# Patient Record
Sex: Male | Born: 1970 | Hispanic: Yes | Marital: Married | Smoking: Never smoker
Health system: Southern US, Community
[De-identification: ages and names within clinical notes are randomized; demographics above are authoritative.]

## PROBLEM LIST (undated history)

## (undated) DIAGNOSIS — Z87442 Personal history of urinary calculi: Secondary | ICD-10-CM

## (undated) DIAGNOSIS — N400 Enlarged prostate without lower urinary tract symptoms: Secondary | ICD-10-CM

## (undated) DIAGNOSIS — R519 Headache, unspecified: Secondary | ICD-10-CM

## (undated) DIAGNOSIS — R51 Headache: Secondary | ICD-10-CM

## (undated) DIAGNOSIS — I10 Essential (primary) hypertension: Secondary | ICD-10-CM

## (undated) DIAGNOSIS — F4310 Post-traumatic stress disorder, unspecified: Secondary | ICD-10-CM

## (undated) DIAGNOSIS — F419 Anxiety disorder, unspecified: Secondary | ICD-10-CM

## (undated) DIAGNOSIS — I82621 Acute embolism and thrombosis of deep veins of right upper extremity: Secondary | ICD-10-CM

## (undated) DIAGNOSIS — K219 Gastro-esophageal reflux disease without esophagitis: Secondary | ICD-10-CM

## (undated) DIAGNOSIS — F32A Depression, unspecified: Secondary | ICD-10-CM

## (undated) DIAGNOSIS — J302 Other seasonal allergic rhinitis: Secondary | ICD-10-CM

## (undated) DIAGNOSIS — I8290 Acute embolism and thrombosis of unspecified vein: Secondary | ICD-10-CM

## (undated) DIAGNOSIS — G8929 Other chronic pain: Secondary | ICD-10-CM

## (undated) DIAGNOSIS — M542 Cervicalgia: Secondary | ICD-10-CM

## (undated) DIAGNOSIS — F329 Major depressive disorder, single episode, unspecified: Secondary | ICD-10-CM

## (undated) NOTE — *Deleted (*Deleted)
   08/24/2020  CC: No chief complaint on file.   Cristian Pena is a 37 y.o. male who returns for a cystoscopy.   He initially presented to his primary earlier this month with several episodes of gross hematuria without clots.  He was also complaining of bilateral testicular pain feeling like his testicles were swollen from time to time.  He experienced intercourse pain with intercourse and eventually developed erectile dysfunction and inability to maintain achieve erections.  He also had some associated urinary urgency frequency.  He was treated with antibiotics for presumed prostatitis (month-long course of Levaquin which he is currently taking).  Flomax and finasteride were also initiated.  Unfortunately, no urinalysis or urine culture was obtained.  He returned to his primary care in follow-up.  On scrotal exam he was noted to have bilateral varicoceles and sent here for further evaluation of this.  At the time, these were felt to be tender.    His urinary symptoms and pain were improving.  He continued to complain of erectile dysfunction, recent since the onset of his symptoms.  He does have a personal history of urinary complaints.  His last seen by Dr. Delton See in 2009 with dysuria and bilateral leg pain as well as burning of the head of his penis and testicular pain during urination.  Most recent PSA 1.2 10/2018  CT hematuria work up on 07/28/2020 noted two tiny nonobstructing left renal stones. No other urinary stone disease evident. No suspicious renal or urinary tract mass lesion. Patchy airspace disease in the central right middle lobe, likely infectious/inflammatory. Small right groin hernia contains only fat.  He does have a family history of prostate cancer in his father.  He is concerned about malignancy.  He also has a personal history of kidney stones, left stone on CT in 2019.  No recent flank pain.   There were no vitals taken for this visit. NED. A&Ox3.   No  respiratory distress   Abd soft, NT, ND Normal phallus with bilateral descended testicles  Cystoscopy Procedure Note  Patient identification was confirmed, informed consent was obtained, and patient was prepped using Betadine solution.  Lidocaine jelly was administered per urethral meatus.     Pre-Procedure: - Inspection reveals a normal caliber ureteral meatus.  Procedure: The flexible cystoscope was introduced without difficulty - No urethral strictures/lesions are present. - {Blank multiple:19197::"Enlarged","Surgically absent","Normal"} prostate *** - {Blank multiple:19197::"Normal","Elevated","Tight"} bladder neck - Bilateral ureteral orifices identified - Bladder mucosa  reveals no ulcers, tumors, or lesions - No bladder stones - No trabeculation  Retroflexion shows ***   Post-Procedure: - Patient tolerated the procedure well  Pertinent image:  I have personally reviewed the images and agree with radiologist interpretation.    Assessment/ Plan:  1. Chronic prostatitis  2. Gross hematuria  3. Bilateral varicoceles  4. Erectile disorder, acquired, situational, severe  5. Family history of prostate cancer    No follow-ups on file.  Cristian Pena, am acting as a scribe for Dr. Vanna Scotland.  {Add Production assistant, radio Statement}

---

## 2004-06-27 ENCOUNTER — Other Ambulatory Visit: Payer: Self-pay

## 2004-07-29 ENCOUNTER — Emergency Department: Payer: Self-pay | Admitting: Unknown Physician Specialty

## 2011-02-13 ENCOUNTER — Ambulatory Visit: Payer: Self-pay | Admitting: Gastroenterology

## 2011-02-15 LAB — PATHOLOGY REPORT

## 2012-12-31 ENCOUNTER — Emergency Department: Payer: Self-pay | Admitting: Emergency Medicine

## 2012-12-31 LAB — DRUG SCREEN, URINE
Amphetamines, Ur Screen: NEGATIVE (ref ?–1000)
Barbiturates, Ur Screen: NEGATIVE (ref ?–200)
Benzodiazepine, Ur Scrn: NEGATIVE (ref ?–200)
Cannabinoid 50 Ng, Ur ~~LOC~~: NEGATIVE (ref ?–50)
Opiate, Ur Screen: NEGATIVE (ref ?–300)
Phencyclidine (PCP) Ur S: NEGATIVE (ref ?–25)
Tricyclic, Ur Screen: NEGATIVE (ref ?–1000)

## 2012-12-31 LAB — URINALYSIS, COMPLETE
Bacteria: NONE SEEN
Bilirubin,UR: NEGATIVE
Glucose,UR: NEGATIVE mg/dL (ref 0–75)
Leukocyte Esterase: NEGATIVE
Nitrite: NEGATIVE
Ph: 5 (ref 4.5–8.0)
Protein: NEGATIVE
WBC UR: <1 /HPF (ref 0–5)

## 2012-12-31 LAB — CBC
HGB: 15.1 g/dL (ref 13.0–18.0)
MCV: 86 fL (ref 80–100)
RBC: 5.29 10*6/uL (ref 4.40–5.90)
WBC: 13.6 10*3/uL — ABNORMAL HIGH (ref 3.8–10.6)

## 2012-12-31 LAB — TSH: Thyroid Stimulating Horm: 0.68 u[IU]/mL

## 2012-12-31 LAB — COMPREHENSIVE METABOLIC PANEL
Albumin: 4.1 g/dL (ref 3.4–5.0)
Calcium, Total: 8.6 mg/dL (ref 8.5–10.1)
Chloride: 108 mmol/L — ABNORMAL HIGH (ref 98–107)
Co2: 26 mmol/L (ref 21–32)
EGFR (African American): >60
Glucose: 138 mg/dL — ABNORMAL HIGH (ref 65–99)
Osmolality: 285 (ref 275–301)
SGOT(AST): 33 U/L (ref 15–37)
SGPT (ALT): 36 U/L (ref 12–78)
Sodium: 140 mmol/L (ref 136–145)
Total Protein: 7.9 g/dL (ref 6.4–8.2)

## 2012-12-31 LAB — ETHANOL
Ethanol %: <0.003 % (ref 0.000–0.080)
Ethanol: <3 mg/dL

## 2013-06-12 ENCOUNTER — Emergency Department: Payer: Self-pay | Admitting: Emergency Medicine

## 2013-06-12 LAB — COMPREHENSIVE METABOLIC PANEL
Alkaline Phosphatase: 91 U/L (ref 50–136)
Anion Gap: 5 — ABNORMAL LOW (ref 7–16)
BUN: 17 mg/dL (ref 7–18)
Bilirubin,Total: 0.4 mg/dL (ref 0.2–1.0)
Chloride: 106 mmol/L (ref 98–107)
EGFR (African American): >60
EGFR (Non-African Amer.): >60
Glucose: 133 mg/dL — ABNORMAL HIGH (ref 65–99)
Osmolality: 277 (ref 275–301)
SGPT (ALT): 33 U/L (ref 12–78)
Sodium: 137 mmol/L (ref 136–145)
Total Protein: 7.3 g/dL (ref 6.4–8.2)

## 2013-06-12 LAB — URINALYSIS, COMPLETE
Bilirubin,UR: NEGATIVE
Glucose,UR: NEGATIVE mg/dL (ref 0–75)
Ketone: NEGATIVE
Leukocyte Esterase: NEGATIVE
Nitrite: NEGATIVE
Ph: 5 (ref 4.5–8.0)
Protein: NEGATIVE
Squamous Epithelial: NONE SEEN
WBC UR: 2 /HPF (ref 0–5)

## 2013-06-12 LAB — DRUG SCREEN, URINE
Barbiturates, Ur Screen: NEGATIVE (ref ?–200)
Benzodiazepine, Ur Scrn: NEGATIVE (ref ?–200)
Cocaine Metabolite,Ur ~~LOC~~: POSITIVE (ref ?–300)
Phencyclidine (PCP) Ur S: NEGATIVE (ref ?–25)

## 2013-06-12 LAB — CBC
MCH: 29.5 pg (ref 26.0–34.0)
MCV: 83 fL (ref 80–100)
Platelet: 201 10*3/uL (ref 150–440)
RDW: 13.4 % (ref 11.5–14.5)
WBC: 7.3 10*3/uL (ref 3.8–10.6)

## 2013-06-12 LAB — LIPASE, BLOOD: Lipase: 129 U/L (ref 73–393)

## 2013-11-18 DIAGNOSIS — F1410 Cocaine abuse, uncomplicated: Secondary | ICD-10-CM | POA: Insufficient documentation

## 2014-02-11 DIAGNOSIS — K645 Perianal venous thrombosis: Secondary | ICD-10-CM | POA: Insufficient documentation

## 2014-06-28 ENCOUNTER — Emergency Department: Payer: Self-pay | Admitting: Emergency Medicine

## 2014-06-28 LAB — COMPREHENSIVE METABOLIC PANEL
ALK PHOS: 107 U/L
Albumin: 3.7 g/dL (ref 3.4–5.0)
Anion Gap: 8 (ref 7–16)
BILIRUBIN TOTAL: 0.5 mg/dL (ref 0.2–1.0)
BUN: 18 mg/dL (ref 7–18)
CALCIUM: 8.4 mg/dL — AB (ref 8.5–10.1)
CHLORIDE: 103 mmol/L (ref 98–107)
CO2: 28 mmol/L (ref 21–32)
CREATININE: 1.07 mg/dL (ref 0.60–1.30)
GLUCOSE: 114 mg/dL — AB (ref 65–99)
Osmolality: 280 (ref 275–301)
POTASSIUM: 3.9 mmol/L (ref 3.5–5.1)
SGOT(AST): 28 U/L (ref 15–37)
SGPT (ALT): 37 U/L
Sodium: 139 mmol/L (ref 136–145)
Total Protein: 7.5 g/dL (ref 6.4–8.2)

## 2014-06-28 LAB — CBC WITH DIFFERENTIAL/PLATELET
BASOS PCT: 0.3 %
Basophil #: 0 10*3/uL (ref 0.0–0.1)
EOS PCT: 0.6 %
Eosinophil #: 0.1 10*3/uL (ref 0.0–0.7)
HCT: 54.2 % — ABNORMAL HIGH (ref 40.0–52.0)
HGB: 18.4 g/dL — AB (ref 13.0–18.0)
LYMPHS PCT: 6.9 %
Lymphocyte #: 0.9 10*3/uL — ABNORMAL LOW (ref 1.0–3.6)
MCH: 29 pg (ref 26.0–34.0)
MCHC: 33.9 g/dL (ref 32.0–36.0)
MCV: 86 fL (ref 80–100)
MONO ABS: 0.5 x10 3/mm (ref 0.2–1.0)
Monocyte %: 4 %
Neutrophil #: 11.6 10*3/uL — ABNORMAL HIGH (ref 1.4–6.5)
Neutrophil %: 88.2 %
Platelet: 223 10*3/uL (ref 150–440)
RBC: 6.34 10*6/uL — ABNORMAL HIGH (ref 4.40–5.90)
RDW: 13.2 % (ref 11.5–14.5)
WBC: 13.2 10*3/uL — AB (ref 3.8–10.6)

## 2014-06-28 LAB — LIPASE, BLOOD: Lipase: 132 U/L (ref 73–393)

## 2014-06-29 LAB — URINALYSIS, COMPLETE
BILIRUBIN, UR: NEGATIVE
BLOOD: NEGATIVE
Bacteria: NONE SEEN
Glucose,UR: NEGATIVE mg/dL (ref 0–75)
Ketone: NEGATIVE
Leukocyte Esterase: NEGATIVE
Nitrite: NEGATIVE
Ph: 6 (ref 4.5–8.0)
Protein: NEGATIVE
Specific Gravity: >1.06 (ref 1.003–1.030)
Squamous Epithelial: NONE SEEN

## 2014-12-24 DIAGNOSIS — R079 Chest pain, unspecified: Secondary | ICD-10-CM | POA: Insufficient documentation

## 2014-12-24 DIAGNOSIS — E782 Mixed hyperlipidemia: Secondary | ICD-10-CM | POA: Insufficient documentation

## 2014-12-24 DIAGNOSIS — R0681 Apnea, not elsewhere classified: Secondary | ICD-10-CM | POA: Insufficient documentation

## 2015-02-17 DIAGNOSIS — G5680 Other specified mononeuropathies of unspecified upper limb: Secondary | ICD-10-CM | POA: Insufficient documentation

## 2015-02-17 DIAGNOSIS — M7581 Other shoulder lesions, right shoulder: Secondary | ICD-10-CM

## 2015-02-17 DIAGNOSIS — M47812 Spondylosis without myelopathy or radiculopathy, cervical region: Secondary | ICD-10-CM | POA: Insufficient documentation

## 2015-02-17 DIAGNOSIS — M778 Other enthesopathies, not elsewhere classified: Secondary | ICD-10-CM | POA: Insufficient documentation

## 2015-02-17 DIAGNOSIS — S43439A Superior glenoid labrum lesion of unspecified shoulder, initial encounter: Secondary | ICD-10-CM | POA: Insufficient documentation

## 2015-04-09 DIAGNOSIS — M778 Other enthesopathies, not elsewhere classified: Secondary | ICD-10-CM | POA: Insufficient documentation

## 2015-04-09 DIAGNOSIS — M7989 Other specified soft tissue disorders: Secondary | ICD-10-CM | POA: Insufficient documentation

## 2015-04-09 DIAGNOSIS — M7582 Other shoulder lesions, left shoulder: Secondary | ICD-10-CM

## 2015-04-20 ENCOUNTER — Emergency Department: Payer: BLUE CROSS/BLUE SHIELD

## 2015-04-20 ENCOUNTER — Emergency Department
Admission: EM | Admit: 2015-04-20 | Discharge: 2015-04-20 | Disposition: A | Discharge status: Home / Self Care | Payer: BLUE CROSS/BLUE SHIELD | Attending: Student | Admitting: Student

## 2015-04-20 ENCOUNTER — Other Ambulatory Visit: Payer: Self-pay

## 2015-04-20 ENCOUNTER — Encounter: Payer: Self-pay | Admitting: General Practice

## 2015-04-20 DIAGNOSIS — R0602 Shortness of breath: Secondary | ICD-10-CM | POA: Insufficient documentation

## 2015-04-20 DIAGNOSIS — I10 Essential (primary) hypertension: Secondary | ICD-10-CM | POA: Insufficient documentation

## 2015-04-20 DIAGNOSIS — R079 Chest pain, unspecified: Secondary | ICD-10-CM | POA: Insufficient documentation

## 2015-04-20 HISTORY — DX: Anxiety disorder, unspecified: F41.9

## 2015-04-20 HISTORY — DX: Essential (primary) hypertension: I10

## 2015-04-20 LAB — TROPONIN I
Troponin I: <0.03 ng/mL (ref <0.031)
Troponin I: <0.03 ng/mL (ref <0.031)

## 2015-04-20 LAB — CBC
HCT: 48.2 % (ref 40.0–52.0)
Hemoglobin: 16.3 g/dL (ref 13.0–18.0)
MCH: 29.5 pg (ref 26.0–34.0)
MCHC: 33.8 g/dL (ref 32.0–36.0)
MCV: 87.2 fL (ref 80.0–100.0)
PLATELETS: 214 10*3/uL (ref 150–440)
RBC: 5.53 MIL/uL (ref 4.40–5.90)
RDW: 13.9 % (ref 11.5–14.5)
WBC: 11.2 10*3/uL — ABNORMAL HIGH (ref 3.8–10.6)

## 2015-04-20 LAB — BASIC METABOLIC PANEL
Anion gap: 8 (ref 5–15)
BUN: 18 mg/dL (ref 6–20)
CALCIUM: 9.2 mg/dL (ref 8.9–10.3)
CO2: 25 mmol/L (ref 22–32)
Chloride: 107 mmol/L (ref 101–111)
Creatinine, Ser: 0.99 mg/dL (ref 0.61–1.24)
GLUCOSE: 110 mg/dL — AB (ref 65–99)
POTASSIUM: 4.3 mmol/L (ref 3.5–5.1)
Sodium: 140 mmol/L (ref 135–145)

## 2015-04-20 MED ORDER — ACETAMINOPHEN 500 MG PO TABS
1000.0000 mg | ORAL_TABLET | Freq: Once | ORAL | Status: AC
  Administered 2015-04-20: 1000 mg via ORAL

## 2015-04-20 MED ORDER — ACETAMINOPHEN 500 MG PO TABS
ORAL_TABLET | ORAL | Status: AC
  Administered 2015-04-20: 1000 mg via ORAL
  Filled 2015-04-20: qty 2

## 2015-04-20 MED ORDER — IBUPROFEN 600 MG PO TABS
ORAL_TABLET | ORAL | Status: AC
  Administered 2015-04-20: 600 mg via ORAL
  Filled 2015-04-20: qty 1

## 2015-04-20 MED ORDER — ASPIRIN 81 MG PO CHEW
324.0000 mg | CHEWABLE_TABLET | Freq: Once | ORAL | Status: AC
  Administered 2015-04-20: 324 mg via ORAL

## 2015-04-20 MED ORDER — ASPIRIN 81 MG PO CHEW
CHEWABLE_TABLET | ORAL | Status: AC
  Administered 2015-04-20: 324 mg via ORAL
  Filled 2015-04-20: qty 4

## 2015-04-20 MED ORDER — IBUPROFEN 600 MG PO TABS
600.0000 mg | ORAL_TABLET | Freq: Once | ORAL | Status: AC
  Administered 2015-04-20: 600 mg via ORAL

## 2015-04-20 NOTE — ED Provider Notes (Signed)
[facility] Emergency Department Provider Note  ____________________________________________  Time seen: Approximately 6:04 PM  I have reviewed the triage vital signs and the nursing notes.   HISTORY  Chief Complaint Chest Pain  Spanish language barrier overcome with use of interpreter.  HPI Cristian Pena is a 44 y.o. male with history of hypertension and anxiety presents for evaluation sudden onset left stabbing chest pain today. Patient reports that he had an argument with his wife earlier today. He walked outside to get some air and at 4:30 PM began having stabbing severe left-sided chest pain associated with lightheadedness and shortness of breath. The pain does not radiate into jaw, back, legs. Currently he has mild residual chest pain. He reports that he has had similar chest pain in the past although it has been less severe. He had a negative stress test 3 or 4 months ago at [place] [facility]. No modifying factors. Prior to today he had been in his usual state of health, no cough, sneezing, runny nose, vomiting, diarrhea, fevers or chills.   Past Medical History  Diagnosis Date  . Hypertension   . Anxiety     There are no active problems to display for this patient.   History reviewed. No pertinent past surgical history.  No current outpatient prescriptions on file.  Allergies Review of patient's allergies indicates no known allergies.  No family history on file.  Social History History  Substance Use Topics  . Smoking status: Never Smoker   . Smokeless tobacco: Not on file  . Alcohol Use: Yes    Review of Systems Constitutional: No fever/chills Eyes: No visual changes. ENT: No sore throat. Cardiovascular: +chest pain. Respiratory: +shortness of breath. Gastrointestinal: No abdominal pain.  No nausea, no vomiting.  No diarrhea.  No constipation. Genitourinary: Negative for dysuria. Musculoskeletal: Negative for back pain. Skin: Negative  for rash. Neurological: Negative for headaches, focal weakness or numbness.  10-point ROS otherwise negative.  ____________________________________________   PHYSICAL EXAM:  Filed Vitals:   04/20/15 1748 04/20/15 1808  BP:  130/96  Pulse:  94  Resp:  24  Height: 5\' 6"  (1.676 m)   Weight: 180 lb (81.647 kg)   SpO2:  97%    VITAL SIGNS: ED Triage Vitals  Enc Vitals Group     BP --      Pulse --      Resp --      Temp --      Temp src --      SpO2 --      Weight 04/20/15 1748 180 lb (81.647 kg)     Height 04/20/15 1748 5\' 6"  (1.676 m)     Head Cir --      Peak Flow --      Pain Score 04/20/15 1749 6     Pain Loc --      Pain Edu? --      Excl. in [place]? --     Constitutional: Alert and oriented. Well appearing and in no acute distress. Becomes tearful when recounting the argument with his wife.  Eyes: Conjunctivae are normal. PERRL. EOMI. Head: Atraumatic. Nose: No congestion/rhinnorhea. Mouth/Throat: Mucous membranes are moist.  Oropharynx non-erythematous. Neck: No stridor.  Cardiovascular: Normal rate, regular rhythm. Grossly normal heart sounds.  Good peripheral circulation. Respiratory: Normal respiratory effort.  No retractions. Lungs CTAB. Gastrointestinal: Soft and nontender. No distention. No abdominal bruits. No CVA tenderness. Genitourinary: deferred Musculoskeletal: No lower extremity tenderness nor edema.  No joint effusions.  Neurologic:  Normal speech and language. No gross focal neurologic deficits are appreciated. Speech is normal. No gait instability. Skin:  Skin is warm, dry and intact. No rash noted. Psychiatric: Mood and affect are normal. Speech and behavior are normal.  ____________________________________________   LABS (all labs ordered are listed, but only abnormal results are displayed)  Labs Reviewed  BASIC METABOLIC PANEL - Abnormal; Notable for the following:    Glucose, Bld 110 (*)    All other components within normal limits  CBC  - Abnormal; Notable for the following:    WBC 11.2 (*)    All other components within normal limits  TROPONIN I   ____________________________________________  EKG  ED ECG REPORT I, Joanne Gavel, the attending physician, personally viewed and interpreted this ECG.   Date: 04/20/2015  EKG Time: 17:55  Rate: 97  Rhythm: normal EKG, normal sinus rhythm  Axis: normal  Intervals:none  ST&T Change: No acute ST segment elevation  ____________________________________________  RADIOLOGY FINDINGS: The cardiomediastinal contours are normal. The lungs are clear. Pulmonary vasculature is normal. No consolidation, pleural effusion, or pneumothorax. No acute osseous abnormalities are seen.  IMPRESSION: No acute pulmonary process.  ____________________________________________   PROCEDURES  Procedure(s) performed: None  Critical Care performed: No  ____________________________________________   INITIAL IMPRESSION / ASSESSMENT AND PLAN / ED COURSE  Pertinent labs & imaging results that were available during my care of the patient were reviewed by me and considered in my medical decision making (see chart for details).  Cristian Pena is a 44 y.o. male with history of hypertension and anxiety presents for evaluation sudden onset left stabbing chest pain today. On exam, he is generally well-appearing and in no acute distress but does become tearful when he talks about the argument he had with his wife today. Vital signs stable, he is afebrile. EKG is reassuring. First troponin negative. I discussed the case with Dr. Ubaldo Glassing of cardiology who has confirmed the patient did have a normal stress test in March 2016. The patient has a heart score of 2, no family history of early coronary artery disease, no personal history of coronary artery disease. He denies risk factors for PE and is PERC negative. Suspect anxiety related chest pain. Not consistent with acute aortic dissection. First troponin is  negative but will obtain a troponin at least 3 hours from onset of symptoms. Aspirin given.   ----------------------------------------- 9:04 PM on 04/20/2015 -----------------------------------------  Troponin negative 2 (with at least one troponin obtained approximately 4 hours after chest pain onset). Pain significantly improved. I discussed lab results today, likely diagnosis of anxiety related chest pain with the assistance of the interpreter. All questions answered. Patient will follow up with his primary care doctor and is comfortable with the discharge plan. ____________________________________________   FINAL CLINICAL IMPRESSION(S) / ED DIAGNOSES  Final diagnoses:  Chest pain, unspecified chest pain type      Joanne Gavel, MD 04/20/15 2105

## 2015-04-20 NOTE — ED Notes (Signed)
Pt. Arrived to ed from home with reports of experiencing left side chest pain. PT states "i got worse today". Pt verbalized experiencing similar pain to left chest before but states "this is bad". Pt alert and oriented. Reports SOB with pain.

## 2015-04-20 NOTE — ED Notes (Signed)
AAOx3.  Skin warm and dry.  No SOB/ DOE.  NAD.  D/C home.  Ambulates with easy and steady gait.  

## 2015-04-20 NOTE — ED Notes (Signed)
Patient reports arguments at home with spouse for the past three days.  States it has been very stressful for him.

## 2015-05-07 DIAGNOSIS — I10 Essential (primary) hypertension: Secondary | ICD-10-CM | POA: Insufficient documentation

## 2015-05-11 ENCOUNTER — Encounter: Payer: Self-pay | Admitting: Emergency Medicine

## 2015-05-11 ENCOUNTER — Inpatient Hospital Stay
Admission: EM | Admit: 2015-05-11 | Discharge: 2015-05-12 | DRG: 558 | Discharge status: Against Medical Advice | Payer: BLUE CROSS/BLUE SHIELD | Attending: Internal Medicine | Admitting: Internal Medicine

## 2015-05-11 DIAGNOSIS — I10 Essential (primary) hypertension: Secondary | ICD-10-CM | POA: Diagnosis present

## 2015-05-11 DIAGNOSIS — Z833 Family history of diabetes mellitus: Secondary | ICD-10-CM | POA: Diagnosis not present

## 2015-05-11 DIAGNOSIS — M6282 Rhabdomyolysis: Secondary | ICD-10-CM | POA: Diagnosis not present

## 2015-05-11 DIAGNOSIS — Z8249 Family history of ischemic heart disease and other diseases of the circulatory system: Secondary | ICD-10-CM

## 2015-05-11 DIAGNOSIS — F419 Anxiety disorder, unspecified: Secondary | ICD-10-CM | POA: Diagnosis present

## 2015-05-11 DIAGNOSIS — G4700 Insomnia, unspecified: Secondary | ICD-10-CM | POA: Diagnosis present

## 2015-05-11 DIAGNOSIS — Z823 Family history of stroke: Secondary | ICD-10-CM

## 2015-05-11 HISTORY — DX: Benign prostatic hyperplasia without lower urinary tract symptoms: N40.0

## 2015-05-11 HISTORY — DX: Post-traumatic stress disorder, unspecified: F43.10

## 2015-05-11 LAB — CBC WITH DIFFERENTIAL/PLATELET
BASOS ABS: 0 10*3/uL (ref 0–0.1)
Basophils Relative: 1 %
EOS ABS: 0.1 10*3/uL (ref 0–0.7)
Eosinophils Relative: 1 %
HEMATOCRIT: 42.1 % (ref 40.0–52.0)
HEMOGLOBIN: 14.2 g/dL (ref 13.0–18.0)
LYMPHS PCT: 29 %
Lymphs Abs: 2.6 10*3/uL (ref 1.0–3.6)
MCH: 29.6 pg (ref 26.0–34.0)
MCHC: 33.8 g/dL (ref 32.0–36.0)
MCV: 87.7 fL (ref 80.0–100.0)
Monocytes Absolute: 0.9 10*3/uL (ref 0.2–1.0)
Monocytes Relative: 10 %
NEUTROS PCT: 59 %
Neutro Abs: 5.5 10*3/uL (ref 1.4–6.5)
PLATELETS: 188 10*3/uL (ref 150–440)
RBC: 4.81 MIL/uL (ref 4.40–5.90)
RDW: 13.5 % (ref 11.5–14.5)
WBC: 9.2 10*3/uL (ref 3.8–10.6)

## 2015-05-11 LAB — BASIC METABOLIC PANEL
Anion gap: 7 (ref 5–15)
BUN: 21 mg/dL — AB (ref 6–20)
CALCIUM: 8.2 mg/dL — AB (ref 8.9–10.3)
CHLORIDE: 107 mmol/L (ref 101–111)
CO2: 24 mmol/L (ref 22–32)
CREATININE: 0.92 mg/dL (ref 0.61–1.24)
GFR calc Af Amer: >60 mL/min (ref >60)
GFR calc non Af Amer: >60 mL/min (ref >60)
Glucose, Bld: 105 mg/dL — ABNORMAL HIGH (ref 65–99)
Potassium: 3.7 mmol/L (ref 3.5–5.1)
Sodium: 138 mmol/L (ref 135–145)

## 2015-05-11 LAB — CK: Total CK: 12088 U/L — ABNORMAL HIGH (ref 49–397)

## 2015-05-11 MED ORDER — TAMSULOSIN HCL 0.4 MG PO CAPS: 0.4000 mg | ORAL_CAPSULE | Freq: Every day | ORAL | Status: DC

## 2015-05-11 MED ORDER — MORPHINE SULFATE 2 MG/ML IJ SOLN: 2.0000 mg | INTRAMUSCULAR | Status: DC | PRN

## 2015-05-11 MED ORDER — OXYCODONE HCL 5 MG PO TABS
5.0000 mg | ORAL_TABLET | ORAL | Status: DC | PRN
  Administered 2015-05-11: 5 mg via ORAL
  Filled 2015-05-11: qty 1

## 2015-05-11 MED ORDER — SODIUM CHLORIDE 0.9 % IV BOLUS (SEPSIS)
1000.0000 mL | Freq: Once | INTRAVENOUS | Status: AC
  Administered 2015-05-11: 1000 mL via INTRAVENOUS

## 2015-05-11 MED ORDER — ACETAMINOPHEN 325 MG PO TABS: 650.0000 mg | ORAL_TABLET | Freq: Four times a day (QID) | ORAL | Status: DC | PRN

## 2015-05-11 MED ORDER — BUPROPION HCL ER (XL) 150 MG PO TB24: 150.0000 mg | ORAL_TABLET | Freq: Every day | ORAL | Status: DC

## 2015-05-11 MED ORDER — HEPARIN SODIUM (PORCINE) 5000 UNIT/ML IJ SOLN
5000.0000 [IU] | Freq: Three times a day (TID) | INTRAMUSCULAR | Status: DC
  Administered 2015-05-11: 5000 [IU] via SUBCUTANEOUS
  Filled 2015-05-11 (×2): qty 1

## 2015-05-11 MED ORDER — MIRTAZAPINE 30 MG PO TABS
45.0000 mg | ORAL_TABLET | Freq: Every day | ORAL | Status: DC
  Administered 2015-05-11: 45 mg via ORAL
  Filled 2015-05-11: qty 1

## 2015-05-11 MED ORDER — ACETAMINOPHEN 650 MG RE SUPP: 650.0000 mg | Freq: Four times a day (QID) | RECTAL | Status: DC | PRN

## 2015-05-11 MED ORDER — SODIUM CHLORIDE 0.9 % IV SOLN
INTRAVENOUS | Status: DC
  Administered 2015-05-11: via INTRAVENOUS

## 2015-05-11 MED ORDER — LISINOPRIL 20 MG PO TABS: 20.0000 mg | ORAL_TABLET | Freq: Every day | ORAL | Status: DC

## 2015-05-11 NOTE — ED Notes (Signed)
MD Scheavitz at bedside at this time

## 2015-05-11 NOTE — ED Notes (Signed)
Labs sent, no new orders at this time.

## 2015-05-11 NOTE — ED Notes (Signed)
MD Hower at bedside. 

## 2015-05-11 NOTE — ED Notes (Signed)
MD Schaevitz at bedside at this time.

## 2015-05-11 NOTE — ED Provider Notes (Signed)
[facility] Emergency Department Provider Note  ____________________________________________  Time seen: Approximately 9 PM  I have reviewed the triage vital signs and the nursing notes.   HISTORY  Chief Complaint Abnormal Lab; Leg Pain; and Anxiety    HPI Cristian Pena is a 44 y.o. male with a history of hypertension and anxiety who presents with lower extremity muscle cramps. He was seen earlier today at the [facility] clinic outpatient and a creatinine kinase was drawn which was over 5000. He says that Sunday night he was pacing in his room overnight for 15 minutes at a time twice an hour all night. He says that this was the last time he did this and did not repeat it last night. He said that he was doing his because of his anxiety. He says that he feels anxious about that things are happening to people in the world, such as "being carried away in a tornado."Denies any suicidal or homicidal ideation. Denies any cocaine use. Does not take a statin. Does take several vitamin supplements. Denies any darkening of his urine.   Past Medical History  Diagnosis Date  . Hypertension   . Anxiety   . BPH (benign prostatic hyperplasia)   . PTSD (post-traumatic stress disorder)     There are no active problems to display for this patient.   History reviewed. No pertinent past surgical history.  No current outpatient prescriptions on file.  Allergies Review of patient's allergies indicates no known allergies.  No family history on file.  Social History History  Substance Use Topics  . Smoking status: Never Smoker   . Smokeless tobacco: Not on file  . Alcohol Use: No    Review of Systems Constitutional: No fever/chills Eyes: No visual changes. ENT: No sore throat. Cardiovascular: Denies chest pain. Respiratory: Denies shortness of breath. Gastrointestinal: No abdominal pain.  No nausea, no vomiting.  No diarrhea.  No constipation. Genitourinary: Negative  for dysuria. Musculoskeletal: Negative for back pain. Skin: Negative for rash. Neurological: Negative for headaches, focal weakness or numbness.  10-point ROS otherwise negative.  ____________________________________________   PHYSICAL EXAM:  VITAL SIGNS: ED Triage Vitals  Enc Vitals Group     BP 05/11/15 1744 118/84 mmHg     Pulse Rate 05/11/15 1744 111     Resp 05/11/15 1744 16     Temp 05/11/15 1744 98.5 F (36.9 C)     Temp Source 05/11/15 1744 Oral     SpO2 05/11/15 1744 97 %     Weight 05/11/15 1742 185 lb (83.915 kg)     Height 05/11/15 1742 5\' 6"  (1.676 m)     Head Cir --      Peak Flow --      Pain Score 05/11/15 1742 6     Pain Loc --      Pain Edu? --      Excl. in [place]? --     Constitutional: Alert and oriented. Well appearing and in no acute distress. Eyes: Conjunctivae are normal. PERRL. EOMI. Head: Atraumatic. Nose: No congestion/rhinnorhea. Mouth/Throat: Mucous membranes are moist.  Oropharynx non-erythematous. Neck: No stridor.   Cardiovascular: Normal rate, regular rhythm. Grossly normal heart sounds.  Good peripheral circulation. Respiratory: Normal respiratory effort.  No retractions. Lungs CTAB. Gastrointestinal: Soft and nontender. No distention. No abdominal bruits. No CVA tenderness. Musculoskeletal: No lower extremity tenderness nor edema.  No joint effusions. Compartments are soft throughout. Able to range lower extremities bilaterally without any distress or pain or restricted range  of motion. Neurologic:  Normal speech and language. No gross focal neurologic deficits are appreciated. No gait instability. Skin:  Skin is warm, dry and intact. No rash noted. Psychiatric: Mood and affect are normal. Speech and behavior are normal.  ____________________________________________   LABS (all labs ordered are listed, but only abnormal results are displayed)  Labs Reviewed  CBC WITH DIFFERENTIAL/PLATELET  URINALYSIS COMPLETEWITH MICROSCOPIC ([place])  CK  BASIC METABOLIC PANEL   ____________________________________________  EKG   ____________________________________________  RADIOLOGY   ____________________________________________   PROCEDURES    ____________________________________________   INITIAL IMPRESSION / ASSESSMENT AND PLAN / ED COURSE  Pertinent labs & imaging results that were available during my care of the patient were reviewed by me and considered in my medical decision making (see chart for details).  ----------------------------------------- 9:43 PM on 05/11/2015 -----------------------------------------  Patient on several supplements including "super module supplements"  and shot b ginseng 400.  He denies taking any excessive amounts of these medications. The shot b as well as super monitor supplement both contain vitamin B as well as ginseng. No obvious ingredient to cause rhabdomyolysis. Signed out to Dr. Lavetta Nielsen for admission. Given work note for job. ____________________________________________   FINAL CLINICAL IMPRESSION(S) / ED DIAGNOSES  Acute rhabdomyolysis. Initial visit.    Orbie Pyo, MD 05/11/15 [phone]

## 2015-05-11 NOTE — ED Notes (Signed)
Pt here from [facility] Ed with c/o leg pain, reports he's been walking a lot for the past 36 hrs. [place] reports CK above 5,000. Pt reports he walks a lot when he becomes very anxious.

## 2015-05-11 NOTE — H&P (Signed)
[name] [place] NAME: Cristian Pena    MR#:  [id number]  DATE OF BIRTH:  07/04/1971   DATE OF ADMISSION:  05/11/2015  PRIMARY CARE PHYSICIAN: Langley Gauss  REQUESTING/REFERRING PHYSICIAN: Schaevitz  CHIEF COMPLAINT:   Chief Complaint  Patient presents with  . Abnormal Lab  . Leg Pain  . Anxiety    HISTORY OF PRESENT ILLNESS:  Cristian Pena  is a 44 y.o. male with a known history of essential hypertension, anxiety disorder not otherwise specified presenting with leg pain. He describes recent increase in anxiety levels secondary to multiple marital issues, due to these he states that he has had increased walking and insomnia over the last 2 day duration. After pacing around his house for the majority of that time he developed leg pain. He describes only pain bilateral in location, sore/cramping quality, 4-5/10 intensity, nonradiating, worse with movements, no relieving factors. With the above decided to present to Hospital further workup and evaluation. Noted to have markedly elevated CK level  Marital issues stem from a history of buying, selling, using cocaine. For a period of time he exiled himself to [place] running from the law where he greatly abused cocaine and also cheated on his wife. He then returned from [place] to "pay for my crimes" he was jailed for 4 years. He states that despite this being numerous years ago and still a problem in his marriage. He denies any current cocaine abuse  PAST MEDICAL HISTORY:   Past Medical History  Diagnosis Date  . Hypertension   . Anxiety   . BPH (benign prostatic hyperplasia)   . PTSD (post-traumatic stress disorder)     PAST SURGICAL HISTORY:  History reviewed. No pertinent past surgical history.  SOCIAL HISTORY:   History  Substance Use Topics  . Smoking status: Never Smoker   . Smokeless tobacco: Not on file  . Alcohol Use: No    FAMILY HISTORY:   Family History   Problem Relation Age of Onset  . Heart failure Neg Hx   . Diabetes Mother   . Stroke Mother   . Hypertension Father     DRUG ALLERGIES:  No Known Allergies  REVIEW OF SYSTEMS:  REVIEW OF SYSTEMS:  CONSTITUTIONAL: Denies fevers, chills, fatigue, weakness.  EYES: Denies blurred vision, double vision, or eye pain.  EARS, NOSE, THROAT: Denies tinnitus, ear pain, hearing loss.  RESPIRATORY: denies cough, shortness of breath, wheezing  CARDIOVASCULAR: Denies chest pain, palpitations, edema.  GASTROINTESTINAL: Denies nausea, vomiting, diarrhea, abdominal pain.  GENITOURINARY: Denies dysuria, hematuria.  ENDOCRINE: Denies nocturia or thyroid problems. HEMATOLOGIC AND LYMPHATIC: Denies easy bruising or bleeding.  SKIN: Denies rash or lesions.  MUSCULOSKELETAL: Denies pain in neck, back, shoulder, knees, hips, or further arthritic symptoms. Positive pain in the legs as described above  NEUROLOGIC: Denies paralysis, paresthesias.  PSYCHIATRIC: Denies anxiety or depressive symptoms. Otherwise full review of systems performed by me is negative.   MEDICATIONS AT HOME:   Prior to Admission medications   Not on File      VITAL SIGNS:  Blood pressure 136/69, pulse 100, temperature 98.5 F (36.9 C), temperature source Oral, resp. rate 16, height 5\' 6"  (1.676 m), weight 185 lb (83.915 kg), SpO2 100 %.  PHYSICAL EXAMINATION:  VITAL SIGNS: Filed Vitals:   05/11/15 2107  BP: 136/69  Pulse: 100  Temp:   Resp: 16   GENERAL:43 y.o.male currently in no acute distress.  HEAD: Normocephalic, atraumatic.  EYES: Pupils equal, round, reactive to light. Extraocular muscles intact. No scleral icterus.  MOUTH: Moist mucosal membrane. Dentition intact. No abscess noted.  EAR, NOSE, THROAT: Clear without exudates. No external lesions.  NECK: Supple. No thyromegaly. No nodules. No JVD.  PULMONARY: Clear to ascultation, without wheeze rails or rhonci. No use of accessory muscles, Good respiratory  effort. good air entry bilaterally CHEST: Nontender to palpation.  CARDIOVASCULAR: S1 and S2. Regular rate and rhythm. No murmurs, rubs, or gallops. No edema. Pedal pulses 2+ bilaterally.  GASTROINTESTINAL: Soft, nontender, nondistended. No masses. Positive bowel sounds. No hepatosplenomegaly.  MUSCULOSKELETAL: No swelling, clubbing, or edema. Range of motion full in all extremities.  NEUROLOGIC: Cranial nerves II through XII are intact. No gross focal neurological deficits. Sensation intact. Reflexes intact.  SKIN: No ulceration, lesions, rashes, or cyanosis. Skin warm and dry. Turgor intact.  PSYCHIATRIC: Mood, affect within normal limits. The patient is awake, alert and oriented x 3. Insight, judgment intact.    LABORATORY PANEL:   CBC  Recent Labs Lab 05/11/15 1747  WBC 9.2  HGB 14.2  HCT 42.1  PLT 188   ------------------------------------------------------------------------------------------------------------------  Chemistries  No results for input(s): NA, K, CL, CO2, GLUCOSE, BUN, CREATININE, CALCIUM, MG, AST, ALT, ALKPHOS, BILITOT in the last 168 hours.  Invalid input(s): GFRCGP ------------------------------------------------------------------------------------------------------------------  Cardiac Enzymes No results for input(s): TROPONINI in the last 168 hours. ------------------------------------------------------------------------------------------------------------------  RADIOLOGY:  No results found.  EKG:   Orders placed or performed during the hospital encounter of 04/20/15  . ED EKG (<58mins upon arrival to the ED)  . ED EKG (<44mins upon arrival to the ED)  . EKG    IMPRESSION AND PLAN:   44 year old Hispanic gentleman history of essential hypertension, anxiety presenting with leg pain.  1. Rhabdomyolysis: Markedly elevated CK level possibly in the setting of pacing around the house, nutritional supplements in particular [employer] [place]-which has a  combination of ginseng, vitamins, orchic substance (testosterone booster derived from bull testicles) regardless etiology treatment with IV fluids, follow CK level  II. Anxiety, not otherwise specified: Continue Wellbutrin, consult psychiatry for further input 3. Essential hypertension: Lisinopril 4. Venous thromboembolism prophylactic: Heparin subcutaneous   All the records are reviewed and case discussed with ED provider. Management plans discussed with the patient, family and they are in agreement.  CODE STATUS: Full  TOTAL TIME TAKING CARE OF THIS PATIENT: 45  minutes.    Milton Sagona,  Karenann Cai.D on 05/11/2015 at 10:18 PM  Between 7am to 6pm - Pager - [phone]  After 6pm: House Pager: - [facility]  Office  [phone]  CC: Primary care physician; Langley Gauss

## 2015-05-12 LAB — COMPREHENSIVE METABOLIC PANEL
ALT: 112 U/L — ABNORMAL HIGH (ref 17–63)
ANION GAP: 7 (ref 5–15)
AST: 280 U/L — AB (ref 15–41)
Albumin: 3.2 g/dL — ABNORMAL LOW (ref 3.5–5.0)
Alkaline Phosphatase: 53 U/L (ref 38–126)
BILIRUBIN TOTAL: 0.6 mg/dL (ref 0.3–1.2)
BUN: 13 mg/dL (ref 6–20)
CO2: 25 mmol/L (ref 22–32)
Calcium: 7.9 mg/dL — ABNORMAL LOW (ref 8.9–10.3)
Chloride: 111 mmol/L (ref 101–111)
Creatinine, Ser: 0.88 mg/dL (ref 0.61–1.24)
GFR calc non Af Amer: >60 mL/min (ref >60)
GLUCOSE: 112 mg/dL — AB (ref 65–99)
Potassium: 3.8 mmol/L (ref 3.5–5.1)
Sodium: 143 mmol/L (ref 135–145)
TOTAL PROTEIN: 5.6 g/dL — AB (ref 6.5–8.1)

## 2015-05-12 LAB — CK: Total CK: 9186 U/L — ABNORMAL HIGH (ref 49–397)

## 2015-05-12 NOTE — Plan of Care (Addendum)
Problem: Phase II Progression Outcomes Goal: Progress activity as tolerated unless otherwise ordered Patient was admitted from ER via a stretcher . Patient received PRN pain med for a pain of 5/10 on bilateral leg. He is hemodynamically independent denied any discomfort. Patient was provided with sterile container for urine collection, NS was at 273mL/hr after speaking with DR. Hower. Wife remained at the bedside overnight. Will continue to monitor. Patient indicated his intention to leave AMA d/t family emergency family issue. Dr. Marcille Blanco was notified. Patient signed the [facility] documentation, patient IV access was discontinued, site looks clean dry and intact. Patient left the unit with his wife at 5 on 05/12/2015

## 2015-05-14 DIAGNOSIS — G5620 Lesion of ulnar nerve, unspecified upper limb: Secondary | ICD-10-CM | POA: Insufficient documentation

## 2015-05-17 ENCOUNTER — Other Ambulatory Visit: Payer: Self-pay | Admitting: Surgery

## 2015-05-17 ENCOUNTER — Emergency Department
Admission: EM | Admit: 2015-05-17 | Discharge: 2015-05-17 | Disposition: A | Discharge status: Home / Self Care | Payer: BLUE CROSS/BLUE SHIELD | Attending: Emergency Medicine | Admitting: Emergency Medicine

## 2015-05-17 ENCOUNTER — Encounter: Payer: Self-pay | Admitting: Emergency Medicine

## 2015-05-17 ENCOUNTER — Emergency Department: Payer: BLUE CROSS/BLUE SHIELD

## 2015-05-17 DIAGNOSIS — Z7901 Long term (current) use of anticoagulants: Secondary | ICD-10-CM | POA: Insufficient documentation

## 2015-05-17 DIAGNOSIS — Z79899 Other long term (current) drug therapy: Secondary | ICD-10-CM | POA: Insufficient documentation

## 2015-05-17 DIAGNOSIS — G5622 Lesion of ulnar nerve, left upper limb: Secondary | ICD-10-CM

## 2015-05-17 DIAGNOSIS — M47812 Spondylosis without myelopathy or radiculopathy, cervical region: Secondary | ICD-10-CM

## 2015-05-17 DIAGNOSIS — R52 Pain, unspecified: Secondary | ICD-10-CM

## 2015-05-17 DIAGNOSIS — I10 Essential (primary) hypertension: Secondary | ICD-10-CM | POA: Insufficient documentation

## 2015-05-17 DIAGNOSIS — I82621 Acute embolism and thrombosis of deep veins of right upper extremity: Secondary | ICD-10-CM | POA: Insufficient documentation

## 2015-05-17 DIAGNOSIS — I82401 Acute embolism and thrombosis of unspecified deep veins of right lower extremity: Secondary | ICD-10-CM

## 2015-05-17 DIAGNOSIS — G5692 Unspecified mononeuropathy of left upper limb: Secondary | ICD-10-CM

## 2015-05-17 DIAGNOSIS — M542 Cervicalgia: Secondary | ICD-10-CM

## 2015-05-17 DIAGNOSIS — M79631 Pain in right forearm: Secondary | ICD-10-CM | POA: Diagnosis present

## 2015-05-17 MED ORDER — RIVAROXABAN 15 MG PO TABS
15.0000 mg | ORAL_TABLET | Freq: Once | ORAL | Status: AC
  Administered 2015-05-17: 15 mg via ORAL
  Filled 2015-05-17: qty 1

## 2015-05-17 MED ORDER — RIVAROXABAN (XARELTO) VTE STARTER PACK (15 & 20 MG): ORAL_TABLET | ORAL | Status: DC

## 2015-05-17 NOTE — ED Notes (Signed)
Pt states he was seen in ED last Friday for anxiety, states he had an IV while in the ED, states he now has pain in right elbow and right upper arm, denies any injury, states pain is worse with palpation

## 2015-05-17 NOTE — ED Provider Notes (Signed)
[facility] Emergency Department Provider Note ____________________________________________  Time seen: Approximately 9:05 PM  I have reviewed the triage vital signs and the nursing notes.   HISTORY  Chief Complaint Arm Pain   HPI Cristian Pena is a 44 y.o. male who presents to the emergency department for right upper extremity pain. He states that he was admitted to the hospital last week for anxiety and muscle pain. He states that he had an IV overnight. Apparently he signed out AMA. He states that the pain in the right forearm is near where the IV had been inserted. He is concerned that there is a piece of the plastic cannula left his arm. States the pain worsened today while being at work using a air hose. He denies musculoskeletal pain other than the forearm. He states he is taking his medications for anxiety.   Past Medical History  Diagnosis Date  . Hypertension   . Anxiety   . BPH (benign prostatic hyperplasia)   . PTSD (post-traumatic stress disorder)     Patient Active Problem List   Diagnosis Date Noted  . Rhabdomyolysis 05/11/2015    History reviewed. No pertinent past surgical history.  Current Outpatient Rx  Name  Route  Sig  Dispense  Refill  . busPIRone (BUSPAR) 7.5 MG tablet   Oral   Take 7.5 mg by mouth 3 (three) times daily.         Marland Kitchen lisinopril (PRINIVIL,ZESTRIL) 20 MG tablet   Oral   Take 20 mg by mouth daily.         . mirtazapine (REMERON) 45 MG tablet   Oral   Take 45 mg by mouth at bedtime.         . tamsulosin (FLOMAX) 0.4 MG CAPS capsule   Oral   Take 0.4 mg by mouth.         . Rivaroxaban (XARELTO STARTER PACK) 15 & 20 MG TBPK      Take as directed on package: Start with one 15mg  tablet by mouth twice a day with food. On Day 22, switch to one 20mg  tablet once a day with food.   51 each   0     Allergies Review of patient's allergies indicates no known allergies.  Family History  Problem Relation Age  of Onset  . Heart failure Neg Hx   . Diabetes Mother   . Stroke Mother   . Hypertension Father     Social History History  Substance Use Topics  . Smoking status: Never Smoker   . Smokeless tobacco: Not on file  . Alcohol Use: No    Review of Systems Constitutional: No recent illness. Eyes: No visual changes. ENT: No sore throat. Cardiovascular: Denies chest pain or palpitations. Respiratory: Denies shortness of breath. Gastrointestinal: No abdominal pain.  Genitourinary: Negative for dysuria. Musculoskeletal: Pain in right forearm. Skin: Negative for rash. Neurological: Negative for headaches, focal weakness or numbness. 10-point ROS otherwise negative.  ____________________________________________   PHYSICAL EXAM:  VITAL SIGNS: ED Triage Vitals  Enc Vitals Group     BP 05/17/15 1803 126/79 mmHg     Pulse Rate 05/17/15 1803 125     Resp 05/17/15 1803 18     Temp 05/17/15 1803 99.3 F (37.4 C)     Temp Source 05/17/15 1803 Oral     SpO2 05/17/15 1803 95 %     Weight 05/17/15 1803 185 lb (83.915 kg)     Height 05/17/15 1803 5\' 6"  (1.676 m)  Head Cir --      Peak Flow --      Pain Score 05/17/15 1803 6     Pain Loc --      Pain Edu? --      Excl. in [place]? --     Constitutional: Alert and oriented. Well appearing and in no acute distress. Eyes: Conjunctivae are normal. EOMI. Head: Atraumatic. Nose: No congestion/rhinnorhea. Neck: No stridor.  Respiratory: Normal respiratory effort.   Musculoskeletal: Tenderness noted in the antecubital area on the right forearm. There are 2 hardened areas where he complains of pain. There is no edema or erythema in the area of pain. Neurologic:  Normal speech and language. No gross focal neurologic deficits are appreciated. Speech is normal. No gait instability. Skin: Contusion noted several inches from the antecubital area. Psychiatric: Mood and affect are normal. Speech and behavior are  normal.  ____________________________________________   LABS (all labs ordered are listed, but only abnormal results are displayed)  Labs Reviewed - No data to display ____________________________________________  RADIOLOGY  Indicates DVT in the cephalic and basilic veins of the right upper extremity ____________________________________________   PROCEDURES  Procedure(s) performed: None   ____________________________________________   INITIAL IMPRESSION / ASSESSMENT AND PLAN / ED COURSE  Pertinent labs & imaging results that were available during my care of the patient were reviewed by me and considered in my medical decision making (see chart for details).  Patient was started on Xarelto tonight. He was given a prescription for the first month. He was advised to call his primary care provider tomorrow to schedule a follow-up appointment.   Of note, labs from 05/12/2015 reviewed. The BUN was 13 and creatinine 0.88 with a GFR of greater than 60. No history of renal failure. ____________________________________________   FINAL CLINICAL IMPRESSION(S) / ED DIAGNOSES  Final diagnoses:  Pain  Deep vein thrombosis (DVT), right       Victorino Dike, FNP 05/17/15 2109  Nena Polio, MD 05/17/15 [phone]

## 2015-05-19 ENCOUNTER — Ambulatory Visit
Admission: RE | Admit: 2015-05-19 | Discharge: 2015-05-19 | Disposition: A | Discharge status: Home / Self Care | Payer: BLUE CROSS/BLUE SHIELD | Source: Ambulatory Visit | Attending: Surgery | Admitting: Surgery

## 2015-05-19 DIAGNOSIS — G5622 Lesion of ulnar nerve, left upper limb: Secondary | ICD-10-CM

## 2015-05-19 DIAGNOSIS — M4802 Spinal stenosis, cervical region: Secondary | ICD-10-CM | POA: Diagnosis not present

## 2015-05-19 DIAGNOSIS — M542 Cervicalgia: Secondary | ICD-10-CM

## 2015-05-19 DIAGNOSIS — G5692 Unspecified mononeuropathy of left upper limb: Secondary | ICD-10-CM | POA: Diagnosis present

## 2015-05-19 DIAGNOSIS — M2578 Osteophyte, vertebrae: Secondary | ICD-10-CM | POA: Insufficient documentation

## 2015-05-19 DIAGNOSIS — M47812 Spondylosis without myelopathy or radiculopathy, cervical region: Secondary | ICD-10-CM

## 2015-05-19 NOTE — Discharge Summary (Signed)
[facility] [place] NAME: Cristian Pena    MR#:  [id number]  DATE OF BIRTH:  01/22/1971  DATE OF ADMISSION:  05/11/2015  ADMITTING PHYSICIAN: Lytle Butte, MD  DATE OF DISCHARGE: 05/12/2015  5:55 AM  PRIMARY CARE PHYSICIAN: Valera Castle, MD    ADMISSION DIAGNOSIS:  Non-traumatic rhabdomyolysis [M62.82]  DISCHARGE DIAGNOSIS:  Principal Problem:   Rhabdomyolysis   SECONDARY DIAGNOSIS:   Past Medical History  Diagnosis Date  . Hypertension   . Anxiety   . BPH (benign prostatic hyperplasia)   . PTSD (post-traumatic stress disorder)     HOSPITAL COURSE:   For complete information please refer to history and physical performed by me 05/11/2015. He was originally chief complaint of leg pain. He was found to have a markedly elevated CK level. Deemed to have nontraumatic rhabdomyolysis. He was initiated on IV fluid resuscitation with the intent to control his symptoms when necessary medication, hydrate IV fluids, trend CK levels. Before we were able to search for further etiology of his condition or treat his condition he decided to leave [place]. It was discussed at that time the risks and benefits of his decision. Ultimately, he decided to leave once again [place]. Because of this no physical exam was done prior to discharge    DISCHARGE CONDITIONS:   Nontraumatic rhabdomyolysis  CONSULTS OBTAINED:     DRUG ALLERGIES:  No Known Allergies  DISCHARGE MEDICATIONS:  There are no discharge medications for this patient.    DISCHARGE INSTRUCTIONS:  Patient left AGAINST MEDICAL ADVICE  DIET:  Regular diet  DISCHARGE CONDITION:  Unchanged, patient left AGAINST MEDICAL ADVICE  ACTIVITY:  Patient left AGAINST MEDICAL ADVICE  OXYGEN:  Home Oxygen: no     DISCHARGE LOCATION:  Patient left AGAINST MEDICAL ADVICE  If you experience worsening of your admission symptoms, develop  shortness of breath, life threatening emergency, suicidal or homicidal thoughts you must seek medical attention immediately by calling 911 or calling your MD immediately  if symptoms less severe.  You Must read complete instructions/literature along with all the possible adverse reactions/side effects for all the Medicines you take and that have been prescribed to you. Take any new Medicines after you have completely understood and accpet all the possible adverse reactions/side effects.   Please note  You were cared for by a hospitalist during your hospital stay. If you have any questions about your discharge medications or the care you received while you were in the hospital after you are discharged, you can call the unit and asked to speak with the hospitalist on call if the hospitalist that took care of you is not available. Once you are discharged, your primary care physician will handle any further medical issues. Please note that NO REFILLS for any discharge medications will be authorized once you are discharged, as it is imperative that you return to your primary care physician (or establish a relationship with a primary care physician if you do not have one) for your aftercare needs so that they can reassess your need for medications and monitor your lab values.    On the day of Discharge:   VITAL SIGNS:  Blood pressure 125/71, pulse 105, temperature 98.5 F (36.9 C), temperature source Oral, resp. rate 18, height 5\' 6"  (1.676 m), weight 189 lb 6.4 oz (85.911 kg), SpO2 97 %.  I/O:  No intake or output data in the 24 hours ending 05/19/15 2025  DATA REVIEW:   CBC No results for input(s): WBC, HGB, HCT, PLT in the last 168 hours.  Chemistries  No results for input(s): NA, K, CL, CO2, GLUCOSE, BUN, CREATININE, CALCIUM, MG, AST, ALT, ALKPHOS, BILITOT in the last 168 hours.  Invalid input(s): GFRCGP  Cardiac Enzymes No results for input(s): TROPONINI in the last 168  hours.  Microbiology Results  No results found for this or any previous visit.  RADIOLOGY:  Mr Cervical Spine Wo Contrast  05/19/2015   CLINICAL DATA:  Left-sided neck and left shoulder/ upper extremity pain for 3 years.  EXAM: MRI CERVICAL SPINE WITHOUT CONTRAST  TECHNIQUE: Multiplanar, multisequence MR imaging of the cervical spine was performed. No intravenous contrast was administered.  COMPARISON:  None.  FINDINGS: Vertebral alignment is normal. Vertebral body heights are preserved. No vertebral marrow edema is seen. There is mild disc space narrowing at C5-6. Craniocervical junction is unremarkable. Cervical spinal cord is normal in signal. Paraspinal soft tissues are unremarkable.  C2-3:  Minimal uncovertebral spurring without stenosis.  C3-4: Shallow central disc protrusion and minimal uncovertebral spurring without significant stenosis.  C4-5: Small central disc protrusion and minimal uncovertebral spurring without stenosis.  C5-6: Broad-based posterior disc osteophyte complex results in moderate spinal stenosis with mild impression on the spinal cord and moderate left neural foraminal stenosis, potentially affecting the left C6 nerve root.  C6-7:  Minimal disc bulging without stenosis.  C7-T1:  Negative.  IMPRESSION: 1. Moderate spinal stenosis and left neural foraminal stenosis at C5-6 due to a prominent posterior disc osteophyte complex. 2. Minimal cervical spondylosis elsewhere without stenosis.   Electronically Signed   By: [place] Bores   On: 05/19/2015 08:51       CODE STATUS: Full  TOTAL TIME TAKING CARE OF THIS PATIENT: Less than 30 minutes.    Hower,  Karenann Cai.D on 05/19/2015 at 8:25 PM  Patient left AGAINST MEDICAL ADVICE  [facility]  Office  [phone]  CC: Primary care physician; Valera Castle, MD

## 2015-05-21 ENCOUNTER — Inpatient Hospital Stay: Payer: BLUE CROSS/BLUE SHIELD | Attending: Oncology | Admitting: Oncology

## 2015-05-21 VITALS — BP 127/86 | HR 89 | Temp 98.3°F | Resp 16 | Wt 187.7 lb

## 2015-05-21 DIAGNOSIS — N200 Calculus of kidney: Secondary | ICD-10-CM | POA: Insufficient documentation

## 2015-05-21 DIAGNOSIS — M4802 Spinal stenosis, cervical region: Secondary | ICD-10-CM | POA: Insufficient documentation

## 2015-05-21 DIAGNOSIS — F419 Anxiety disorder, unspecified: Secondary | ICD-10-CM | POA: Insufficient documentation

## 2015-05-21 DIAGNOSIS — F329 Major depressive disorder, single episode, unspecified: Secondary | ICD-10-CM | POA: Diagnosis not present

## 2015-05-21 DIAGNOSIS — K219 Gastro-esophageal reflux disease without esophagitis: Secondary | ICD-10-CM | POA: Insufficient documentation

## 2015-05-21 DIAGNOSIS — I10 Essential (primary) hypertension: Secondary | ICD-10-CM | POA: Diagnosis not present

## 2015-05-21 DIAGNOSIS — I82621 Acute embolism and thrombosis of deep veins of right upper extremity: Secondary | ICD-10-CM | POA: Diagnosis not present

## 2015-05-21 DIAGNOSIS — F4310 Post-traumatic stress disorder, unspecified: Secondary | ICD-10-CM

## 2015-05-21 DIAGNOSIS — M479 Spondylosis, unspecified: Secondary | ICD-10-CM | POA: Diagnosis not present

## 2015-05-21 DIAGNOSIS — N400 Enlarged prostate without lower urinary tract symptoms: Secondary | ICD-10-CM | POA: Insufficient documentation

## 2015-05-21 DIAGNOSIS — F32A Depression, unspecified: Secondary | ICD-10-CM | POA: Insufficient documentation

## 2015-05-21 DIAGNOSIS — J302 Other seasonal allergic rhinitis: Secondary | ICD-10-CM | POA: Insufficient documentation

## 2015-05-21 DIAGNOSIS — Z87442 Personal history of urinary calculi: Secondary | ICD-10-CM | POA: Diagnosis not present

## 2015-05-21 DIAGNOSIS — R51 Headache: Secondary | ICD-10-CM | POA: Insufficient documentation

## 2015-05-21 DIAGNOSIS — E669 Obesity, unspecified: Secondary | ICD-10-CM | POA: Insufficient documentation

## 2015-05-21 DIAGNOSIS — Z7901 Long term (current) use of anticoagulants: Secondary | ICD-10-CM | POA: Diagnosis not present

## 2015-05-27 ENCOUNTER — Encounter: Payer: Self-pay | Admitting: Anesthesiology
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
## 2015-06-02 ENCOUNTER — Ambulatory Visit: Admission: RE | Admit: 2015-06-02 | Payer: BLUE CROSS/BLUE SHIELD | Source: Ambulatory Visit | Admitting: Surgery

## 2015-06-02 HISTORY — DX: Depression, unspecified: F32.A

## 2015-06-02 HISTORY — DX: Other seasonal allergic rhinitis: J30.2

## 2015-06-02 HISTORY — DX: Personal history of urinary calculi: Z87.442

## 2015-06-02 HISTORY — DX: Major depressive disorder, single episode, unspecified: F32.9

## 2015-06-02 HISTORY — DX: Gastro-esophageal reflux disease without esophagitis: K21.9

## 2015-06-02 HISTORY — DX: Cervicalgia: M54.2

## 2015-06-02 HISTORY — DX: Other chronic pain: G89.29

## 2015-06-02 HISTORY — DX: Headache: R51

## 2015-06-02 HISTORY — DX: Headache, unspecified: R51.9

## 2015-06-02 HISTORY — DX: Acute embolism and thrombosis of unspecified vein: I82.90

## 2015-06-02 SURGERY — MINOR EXCISION OF MASS
Anesthesia: Choice | Laterality: Left

## 2015-08-24 ENCOUNTER — Telehealth: Payer: Self-pay | Admitting: Oncology

## 2015-08-24 NOTE — Telephone Encounter (Signed)
Please see my clinic note from august.  Thanks!

## 2015-08-24 NOTE — Telephone Encounter (Signed)
Forwarded to MD.

## 2015-08-24 NOTE — Telephone Encounter (Signed)
Patient came to my desk and said he took his last xarelto today and wants to know what to do now. Please advise: [phone]

## 2015-08-24 NOTE — Telephone Encounter (Signed)
Informed that no further intervention needed after completing the 3 month Xarelto.

## 2015-09-06 ENCOUNTER — Other Ambulatory Visit: Payer: BLUE CROSS/BLUE SHIELD

## 2015-09-08 ENCOUNTER — Encounter
Admission: RE | Admit: 2015-09-08 | Discharge: 2015-09-08 | Disposition: A | Discharge status: Home / Self Care | Payer: BLUE CROSS/BLUE SHIELD | Source: Ambulatory Visit | Attending: Surgery | Admitting: Surgery

## 2015-09-08 NOTE — Patient Instructions (Signed)
  Your procedure is scheduled on: Tuesday 09/14/2015 Report to Day Surgery. 2ND FLOOR MEDICAL MALL ENTRANCE To find out your arrival time please call [phone] between 1PM - 3PM on Monday 09/13/2015.  Remember: Instructions that are not followed completely may result in serious medical risk, up to and including death, or upon the discretion of your surgeon and anesthesiologist your surgery may need to be rescheduled.    __X__ 1. Do not eat food or drink liquids after midnight. No gum chewing or hard candies.     __X__ 2. No Alcohol for 24 hours before or after surgery.   ____ 3. Bring all medications with you on the day of surgery if instructed.    __X__ 4. Notify your doctor if there is any change in your medical condition     (cold, fever, infections).     Do not wear jewelry, make-up, hairpins, clips or nail polish.  Do not wear lotions, powders, or perfumes. You may wear deodorant.  Do not shave 48 hours prior to surgery. Men may shave face and neck.  Do not bring valuables to the hospital.    [facility] is not responsible for any belongings or valuables.               Contacts, dentures or bridgework may not be worn into surgery.  Leave your suitcase in the car. After surgery it may be brought to your room.  For patients admitted to the hospital, discharge time is determined by your                treatment team.   Patients discharged the day of surgery will not be allowed to drive home.   Please read over the following fact sheets that you were given:   Surgical Site Infection Prevention   __X__ Take these medicines the morning of surgery with A SIP OF WATER:    1. LISINOPRIL  2. RANITIDINE  3.   4.  5.  6.  ____ Fleet Enema (as directed)   __X__ Use CHG Soap as directed  ____ Use inhalers on the day of surgery  ____ Stop metformin 2 days prior to surgery    ____ Take 1/2 of usual insulin dose the night before surgery and none on the morning of surgery.    ____ Stop Coumadin/Plavix/aspirin on   ____ Stop Anti-inflammatories on    ____ Stop supplements until after surgery.    ____ Bring C-Pap to the hospital.

## 2015-09-14 ENCOUNTER — Encounter
Admission: RE | Disposition: A | Discharge status: Home / Self Care | Payer: Self-pay | Source: Ambulatory Visit | Attending: Surgery

## 2015-09-14 ENCOUNTER — Ambulatory Visit: Payer: BLUE CROSS/BLUE SHIELD | Admitting: Anesthesiology

## 2015-09-14 ENCOUNTER — Encounter: Payer: Self-pay | Admitting: *Deleted

## 2015-09-14 ENCOUNTER — Ambulatory Visit
Admission: RE | Admit: 2015-09-14 | Discharge: 2015-09-14 | Disposition: A | Discharge status: Home / Self Care | Payer: BLUE CROSS/BLUE SHIELD | Source: Ambulatory Visit | Attending: Surgery | Admitting: Surgery

## 2015-09-14 DIAGNOSIS — F4310 Post-traumatic stress disorder, unspecified: Secondary | ICD-10-CM | POA: Diagnosis not present

## 2015-09-14 DIAGNOSIS — Z8249 Family history of ischemic heart disease and other diseases of the circulatory system: Secondary | ICD-10-CM | POA: Insufficient documentation

## 2015-09-14 DIAGNOSIS — E669 Obesity, unspecified: Secondary | ICD-10-CM | POA: Diagnosis not present

## 2015-09-14 DIAGNOSIS — F419 Anxiety disorder, unspecified: Secondary | ICD-10-CM | POA: Insufficient documentation

## 2015-09-14 DIAGNOSIS — I10 Essential (primary) hypertension: Secondary | ICD-10-CM | POA: Diagnosis not present

## 2015-09-14 DIAGNOSIS — Z79899 Other long term (current) drug therapy: Secondary | ICD-10-CM | POA: Insufficient documentation

## 2015-09-14 DIAGNOSIS — Z8261 Family history of arthritis: Secondary | ICD-10-CM | POA: Diagnosis not present

## 2015-09-14 DIAGNOSIS — Z841 Family history of disorders of kidney and ureter: Secondary | ICD-10-CM | POA: Diagnosis not present

## 2015-09-14 DIAGNOSIS — Z823 Family history of stroke: Secondary | ICD-10-CM | POA: Insufficient documentation

## 2015-09-14 DIAGNOSIS — Z87442 Personal history of urinary calculi: Secondary | ICD-10-CM | POA: Diagnosis not present

## 2015-09-14 DIAGNOSIS — D1722 Benign lipomatous neoplasm of skin and subcutaneous tissue of left arm: Secondary | ICD-10-CM | POA: Insufficient documentation

## 2015-09-14 DIAGNOSIS — Z803 Family history of malignant neoplasm of breast: Secondary | ICD-10-CM | POA: Diagnosis not present

## 2015-09-14 DIAGNOSIS — K219 Gastro-esophageal reflux disease without esophagitis: Secondary | ICD-10-CM | POA: Diagnosis not present

## 2015-09-14 DIAGNOSIS — F329 Major depressive disorder, single episode, unspecified: Secondary | ICD-10-CM | POA: Diagnosis not present

## 2015-09-14 DIAGNOSIS — Z8489 Family history of other specified conditions: Secondary | ICD-10-CM | POA: Insufficient documentation

## 2015-09-14 DIAGNOSIS — Z7951 Long term (current) use of inhaled steroids: Secondary | ICD-10-CM | POA: Diagnosis not present

## 2015-09-14 DIAGNOSIS — Z818 Family history of other mental and behavioral disorders: Secondary | ICD-10-CM | POA: Diagnosis not present

## 2015-09-14 DIAGNOSIS — Z833 Family history of diabetes mellitus: Secondary | ICD-10-CM | POA: Insufficient documentation

## 2015-09-14 DIAGNOSIS — Z6830 Body mass index (BMI) 30.0-30.9, adult: Secondary | ICD-10-CM | POA: Insufficient documentation

## 2015-09-14 HISTORY — PX: EXCISION MASS UPPER EXTREMETIES: SHX6704

## 2015-09-14 SURGERY — EXCISION MASS UPPER EXTREMITIES
Anesthesia: General | Laterality: Left

## 2015-09-14 MED ORDER — MIDAZOLAM HCL 2 MG/2ML IJ SOLN
INTRAMUSCULAR | Status: DC | PRN
  Administered 2015-09-14: 2 mg via INTRAVENOUS

## 2015-09-14 MED ORDER — FENTANYL CITRATE (PF) 100 MCG/2ML IJ SOLN
INTRAMUSCULAR | Status: AC
  Administered 2015-09-14: 25 ug via INTRAVENOUS
  Filled 2015-09-14: qty 2

## 2015-09-14 MED ORDER — NEOMYCIN-POLYMYXIN B GU 40-200000 IR SOLN
Status: AC
  Filled 2015-09-14: qty 2

## 2015-09-14 MED ORDER — FENTANYL CITRATE (PF) 100 MCG/2ML IJ SOLN
25.0000 ug | INTRAMUSCULAR | Status: DC | PRN
  Administered 2015-09-14 (×4): 25 ug via INTRAVENOUS

## 2015-09-14 MED ORDER — CEFAZOLIN SODIUM-DEXTROSE 2-3 GM-% IV SOLR
INTRAVENOUS | Status: AC
  Filled 2015-09-14: qty 50

## 2015-09-14 MED ORDER — LIDOCAINE HCL (CARDIAC) 20 MG/ML IV SOLN
INTRAVENOUS | Status: DC | PRN
  Administered 2015-09-14: 100 mg via INTRAVENOUS

## 2015-09-14 MED ORDER — BUPIVACAINE HCL (PF) 0.5 % IJ SOLN
INTRAMUSCULAR | Status: AC
  Filled 2015-09-14: qty 30

## 2015-09-14 MED ORDER — ONDANSETRON HCL 4 MG/2ML IJ SOLN
INTRAMUSCULAR | Status: DC | PRN
  Administered 2015-09-14: 4 mg via INTRAVENOUS

## 2015-09-14 MED ORDER — PROMETHAZINE HCL 25 MG/ML IJ SOLN: 6.2500 mg | INTRAMUSCULAR | Status: DC | PRN

## 2015-09-14 MED ORDER — LACTATED RINGERS IV SOLN
INTRAVENOUS | Status: DC
  Administered 2015-09-14 (×2): via INTRAVENOUS

## 2015-09-14 MED ORDER — PROPOFOL 10 MG/ML IV BOLUS
INTRAVENOUS | Status: DC | PRN
  Administered 2015-09-14: 160 mg via INTRAVENOUS

## 2015-09-14 MED ORDER — CEFAZOLIN SODIUM-DEXTROSE 2-3 GM-% IV SOLR
2.0000 g | Freq: Once | INTRAVENOUS | Status: AC
  Administered 2015-09-14: 2 g via INTRAVENOUS

## 2015-09-14 MED ORDER — FENTANYL CITRATE (PF) 100 MCG/2ML IJ SOLN
INTRAMUSCULAR | Status: DC | PRN
  Administered 2015-09-14 (×2): 50 ug via INTRAVENOUS

## 2015-09-14 SURGICAL SUPPLY — 27 items
BANDAGE ELASTIC 4 LF NS (GAUZE/BANDAGES/DRESSINGS) ×3 IMPLANT
BNDG ESMARK 4X12 TAN STRL LF (GAUZE/BANDAGES/DRESSINGS) ×3 IMPLANT
CANISTER SUCT 1200ML W/VALVE (MISCELLANEOUS) ×3 IMPLANT
CHLORAPREP W/TINT 26ML (MISCELLANEOUS) ×3 IMPLANT
CLOSURE WOUND 1/2 X4 (GAUZE/BANDAGES/DRESSINGS) ×1
CORD BIP STRL DISP 12FT (MISCELLANEOUS) ×3 IMPLANT
DRAPE IMP U-DRAPE 54X76 (DRAPES) ×3 IMPLANT
DRAPE SURG 17X11 SM STRL (DRAPES) ×3 IMPLANT
FORCEPS JEWEL BIP 4-3/4 STR (INSTRUMENTS) ×3 IMPLANT
GAUZE SPONGE 4X4 12PLY STRL (GAUZE/BANDAGES/DRESSINGS) IMPLANT
GLOVE INDICATOR 8.0 STRL GRN (GLOVE) ×3 IMPLANT
GOWN STRL REUS W/ TWL LRG LVL3 (GOWN DISPOSABLE) ×1 IMPLANT
GOWN STRL REUS W/ TWL XL LVL3 (GOWN DISPOSABLE) ×1 IMPLANT
GOWN STRL REUS W/TWL LRG LVL3 (GOWN DISPOSABLE) ×2
GOWN STRL REUS W/TWL XL LVL3 (GOWN DISPOSABLE) ×2
KIT RM TURNOVER STRD PROC AR (KITS) ×3 IMPLANT
NS IRRIG 500ML POUR BTL (IV SOLUTION) ×3 IMPLANT
PACK EXTREMITY ARMC (MISCELLANEOUS) ×3 IMPLANT
PAD CAST CTTN 4X4 STRL (SOFTGOODS) ×1 IMPLANT
PAD GROUND ADULT SPLIT (MISCELLANEOUS) ×3 IMPLANT
PAD PREP 24X41 OB/GYN DISP (PERSONAL CARE ITEMS) IMPLANT
PADDING CAST COTTON 4X4 STRL (SOFTGOODS) ×2
STRIP CLOSURE SKIN 1/2X4 (GAUZE/BANDAGES/DRESSINGS) ×2 IMPLANT
SUT PROLENE 4 0 PS 2 18 (SUTURE) ×3 IMPLANT
SUT VIC AB 2-0 SH 27 (SUTURE) ×2
SUT VIC AB 2-0 SH 27XBRD (SUTURE) ×1 IMPLANT
SUT VIC AB 3-0 PS2 18 (SUTURE) ×3 IMPLANT

## 2015-09-14 NOTE — H&P (Signed)
Paper H&P to be scanned into permanent record. H&P reviewed. No changes. 
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
## 2015-09-14 NOTE — Transfer of Care (Signed)
Immediate Anesthesia Transfer of Care Note  Patient: Cristian Pena  Procedure(s) Performed: Procedure(s): EXCISION MASS UPPER EXTREMETIES (Left)  Patient Location: PACU  Anesthesia Type:General  Level of Consciousness: awake, alert  and oriented  Airway & Oxygen Therapy: Patient Spontanous Breathing and Patient connected to face mask oxygen  Post-op Assessment: Report given to RN and Post -op Vital signs reviewed and stable  Post vital signs: Reviewed and stable  Last Vitals:  Filed Vitals:   09/14/15 0617  BP: 112/86  Temp: 36.9 C  Resp: 14    Complications: No apparent anesthesia complications

## 2015-09-14 NOTE — Discharge Instructions (Addendum)
Keep hand elevated above heart level. May shower with intact op-site dressing. Apply ice to affected area frequently. May use left hand without restrictions. Return for follow-up in 10-14 days or as scheduled.

## 2015-09-14 NOTE — Anesthesia Postprocedure Evaluation (Signed)
Anesthesia Post Note  Patient: Cristian Pena  Procedure(s) Performed: Procedure(s) (LRB): EXCISION MASS UPPER EXTREMETIES (Left)  Patient location during evaluation: PACU Anesthesia Type: General Level of consciousness: awake and alert Pain management: pain level controlled Vital Signs Assessment: post-procedure vital signs reviewed and stable Respiratory status: spontaneous breathing, nonlabored ventilation, respiratory function stable and patient connected to nasal cannula oxygen Cardiovascular status: blood pressure returned to baseline and stable Postop Assessment: No signs of nausea or vomiting Anesthetic complications: no    Last Vitals:  Filed Vitals:   09/14/15 0941 09/14/15 1011  BP: 119/90 108/68  Pulse: 82 85  Temp: 36.3 C   Resp: 16 16    Last Pain:  Filed Vitals:   09/14/15 1013  PainSc: 2                  Martha Clan

## 2015-09-14 NOTE — Op Note (Signed)
09/14/2015  8:15 AM  Patient:   Cristian Pena  Pre-Op Diagnosis:   Soft tissue mass, left volar forearm.  Post-Op Diagnosis:   Same.  Procedure:   Excision lipoma, left volar forearm.  Surgeon:   Pascal Lux, MD  Assistant:   None  Anesthesia:   General LMA  Findings:   As above. The soft tissue mass is consistent with a lipoma.  Complications:   None  EBL:   1 cc  Fluids:   500 cc crystalloid  TT:   None  Drains:   None  Closure:   2-0 Vicryl subcuticular sutures  Implants:   None  Brief Clinical Note:   The patient is a 44 year old male with a 6+ month history of a soft tissue mass on the volar aspect of his mid forearm. The mass has become increasingly uncomfortable, prompting the patient to ask for it to be removed. The patient presents at this time for excision of the soft tissue mass.  Procedure:   The patient was brought into the operating room and lain in the supine position. After adequate general laryngal mask anesthesia was obtained, the patient's left hand and upper extremity were prepped with ChloraPrep solution before being draped sterilely. Preoperative antibiotics were administered. An approximately 1 cm incision was made longitudinally directly over the mass. The incision was carried down through the subcutaneous tissues. The mass was readily identified and dissected free circumferentially before being removed and sent to pathology for further evaluation. The mass had the appearance of a benign appearing lipoma. Hemostasis was achieved using bipolar electrocautery. The wound was copiously irrigated with sterile saline solution before being closed using 2-0 Vicryl subcuticular sutures. Benzoin and Steri-Strips were applied to the skin before an occlusive honeycomb dressing was applied to the wound. The patient was then awakened, extubated, and returned to the recovery room in satisfactory condition after tolerating the procedure well.

## 2015-09-15 LAB — SURGICAL PATHOLOGY

## 2015-10-22 ENCOUNTER — Other Ambulatory Visit: Payer: Self-pay | Admitting: Surgery

## 2015-10-22 DIAGNOSIS — M7582 Other shoulder lesions, left shoulder: Secondary | ICD-10-CM

## 2015-11-11 ENCOUNTER — Ambulatory Visit
Admission: RE | Admit: 2015-11-11 | Discharge: 2015-11-11 | Disposition: A | Discharge status: Home / Self Care | Payer: BLUE CROSS/BLUE SHIELD | Source: Ambulatory Visit | Attending: Surgery | Admitting: Surgery

## 2015-11-11 DIAGNOSIS — M7582 Other shoulder lesions, left shoulder: Secondary | ICD-10-CM

## 2015-11-11 DIAGNOSIS — M25512 Pain in left shoulder: Secondary | ICD-10-CM | POA: Insufficient documentation

## 2015-11-11 DIAGNOSIS — M25812 Other specified joint disorders, left shoulder: Secondary | ICD-10-CM | POA: Diagnosis not present

## 2015-11-11 DIAGNOSIS — M67814 Other specified disorders of tendon, left shoulder: Secondary | ICD-10-CM | POA: Diagnosis not present

## 2015-11-11 DIAGNOSIS — S43432A Superior glenoid labrum lesion of left shoulder, initial encounter: Secondary | ICD-10-CM | POA: Insufficient documentation

## 2015-11-16 ENCOUNTER — Encounter: Payer: Self-pay | Admitting: *Deleted

## 2015-11-23 NOTE — Discharge Instructions (Signed)
[facility] °[facility] °ENDOSCOPIC SINUS SURGERY °[facility] ° °What is Functional Endoscopic Sinus Surgery? ° The Surgery involves making the natural openings of the sinuses larger by removing the bony partitions that separate the sinuses from the nasal cavity.  The natural sinus lining is preserved as much as possible to allow the sinuses to resume normal function after the surgery.  In some patients nasal polyps (excessively swollen lining of the sinuses) may be removed to relieve obstruction of the sinus openings.  The surgery is performed through the nose using lighted scopes, which eliminates the need for incisions on the face.  A septoplasty is a different procedure which is sometimes performed with sinus surgery.  It involves straightening the boy partition that separates the two sides of your nose.  A crooked or deviated septum may need repair if is obstructing the sinuses or nasal airflow.  Turbinate reduction is also often performed during sinus surgery.  The turbinates are bony proturberances from the side walls of the nose which swell and can obstruct the nose in patients with sinus and allergy problems.  Their size can be surgically reduced to help relieve nasal obstruction. ° °What Can Sinus Surgery Do For Me? ° Sinus surgery can reduce the frequency of sinus infections requiring antibiotic treatment.  This can provide improvement in nasal congestion, post-nasal drainage, facial pressure and nasal obstruction.  Surgery will NOT prevent you from ever having an infection again, so it usually only for patients who get infections 4 or more times yearly requiring antibiotics, or for infections that do not clear with antibiotics.  It will not cure nasal allergies, so patients with allergies may still require medication to treat their allergies after surgery. Surgery may improve headaches related to sinusitis, however, some people will continue to  require medication to control sinus headaches related to allergies.  Surgery will do nothing for other forms of headache (migraine, tension or cluster). ° °What Are the Risks of Endoscopic Sinus Surgery? ° Current techniques allow surgery to be performed safely with little risk, however, there are rare complications that patients should be aware of.  Because the sinuses are located around the eyes, there is risk of eye injury, including blindness, though again, this would be quite rare. This is usually a result of bleeding behind the eye during surgery, which puts the vision oat risk, though there are treatments to protect the vision and prevent permanent disrupted by surgery causing a leak of the spinal fluid that surrounds the brain.  More serious complications would include bleeding inside the brain cavity or damage to the brain.  Again, all of these complications are uncommon, and spinal fluid leaks can be safely managed surgically if they occur.  The most common complication of sinus surgery is bleeding from the nose, which may require packing or cauterization of the nose.  Continued sinus have polyps may experience recurrence of the polyps requiring revision surgery.  Alterations of sense of smell or injury to the tear ducts are also rare complications.  ° °What is the Surgery Like, and what is the Recovery? ° The Surgery usually takes a couple of hours to perform, and is usually performed under a general anesthetic (completely asleep).  Patients are usually discharged home after a couple of hours.  Sometimes during surgery it is necessary to pack the nose to control bleeding, and the packing is left in place for 24 - 48 hours, and removed by your surgeon.    If a septoplasty was performed during the procedure, there is often a splint placed which must be removed after 5-7 days.   °Discomfort: Pain is usually mild to moderate, and can be controlled by prescription pain medication or acetaminophen (Tylenol).   Aspirin, Ibuprofen (Advil, Motrin), or Naprosyn (Aleve) should be avoided, as they can cause increased bleeding.  Most patients feel sinus pressure like they have a bad head cold for several days.  Sleeping with your head elevated can help reduce swelling and facial pressure, as can ice packs over the face.  A humidifier may be helpful to keep the mucous and blood from drying in the nose.  ° °Diet: There are no specific diet restrictions, however, you should generally start with clear liquids and a light diet of bland foods because the anesthetic can cause some nausea.  Advance your diet depending on how your stomach feels.  Taking your pain medication with food will often help reduce stomach upset which pain medications can cause. ° °Nasal Saline Irrigation: It is important to remove blood clots and dried mucous from the nose as it is healing.  This is done by having you irrigate the nose at least 3 - 4 times daily with a salt water solution.  We recommend using NeilMed Sinus Rinse (available at the drug store).  Fill the squeeze bottle with the solution, bend over a sink, and insert the tip of the squeeze bottle into the nose ½ of an inch.  Point the tip of the squeeze bottle towards the inside corner of the eye on the same side your irrigating.  Squeeze the bottle and gently irrigate the nose.  If you bend forward as you do this, most of the fluid will flow back out of the nose, instead of down your throat.   The solution should be warm, near body temperature, when you irrigate.   Each time you irrigate, you should use a full squeeze bottle.  ° °Note that if you are instructed to use Nasal Steroid Sprays at any time after your surgery, irrigate with saline BEFORE using the steroid spray, so you do not wash it all out of the nose. °Another product, Nasal Saline Gel (such as AYR Nasal Saline Gel) can be applied in each nostril 3 - 4 times daily to moisture the nose and reduce scabbing or crusting. ° °Bleeding:   Bloody drainage from the nose can be expected for several days, and patients are instructed to irrigate their nose frequently with salt water to help remove mucous and blood clots.  The drainage may be dark red or brown, though some fresh blood may be seen intermittently, especially after irrigation.  Do not blow you nose, as bleeding may occur. If you must sneeze, keep your mouth open to allow air to escape through your mouth. ° °If heavy bleeding occurs: Irrigate the nose with saline to rinse out clots, then spray the nose 3 - 4 times with Afrin Nasal Decongestant Spray.  The spray will constrict the blood vessels to slow bleeding.  Pinch the lower half of your nose shut to apply pressure, and lay down with your head elevated.  Ice packs over the nose may help as well. If bleeding persists despite these measures, you should notify your doctor.  Do not use the Afrin routinely to control nasal congestion after surgery, as it can result in worsening congestion and may affect healing.  ° ° ° °Activity: Return to work varies among patients. Most patients will be   out of work at least 5 - 7 days to recover.  Patient may return to work after they are off of narcotic pain medication, and feeling well enough to perform the functions of their job.  Patients must avoid heavy lifting (over 10 pounds) or strenuous physical for 2 weeks after surgery, so your employer may need to assign you to light duty, or keep you out of work longer if light duty is not possible.  NOTE: you should not drive, operate dangerous machinery, do any mentally demanding tasks or make any important legal or financial decisions while on narcotic pain medication and recovering from the general anesthetic.    Call Your Doctor Immediately if You Have Any of the Following: 1. Bleeding that you cannot control with the above measures 2. Loss of vision, double vision, bulging of the eye or black eyes. 3. Fever over 101 degrees 4. Neck stiffness with  severe headache, fever, nausea and change in mental state. You are always encourage to call anytime with concerns, however, please call with requests for pain medication refills during office hours.  Office Endoscopy: During follow-up visits your doctor will remove any packing or splints that may have been placed and evaluate and clean your sinuses endoscopically.  Topical anesthetic will be used to make this as comfortable as possible, though you may want to take your pain medication prior to the visit.  How often this will need to be done varies from patient to patient.  After complete recovery from the surgery, you may need follow-up endoscopy from time to time, particularly if there is concern of recurrent infection or nasal polyps.  Anestesia general, adultos, cuidados posteriores (General Anesthesia, Adult, Care After) Siga estas instrucciones durante las prximas semanas. Estas indicaciones le proporcionan informacin acerca de cmo deber cuidarse despus del procedimiento. El mdico tambin podr darle instrucciones ms especficas. El tratamiento se ha planificado de acuerdo a las prcticas mdicas actuales, pero a veces se producen problemas. Comunquese con el mdico si tiene algn problema o tiene dudas despus del procedimiento. QU ESPERAR DESPUS DEL PROCEDIMIENTO Despus del procedimiento es habitual experimentar:  Somnolencia.  Nuseas y vmitos. INSTRUCCIONES PARA EL CUIDADO EN EL HOGAR  Durante las primeras 24 horas luego de la anestesia general:  Haga que una persona responsable se quede con usted.  No conduzca un automvil. Si est solo, no viaje en transporte pblico.  No beba alcohol.  No tome medicamentos que no le haya recetado su mdico.  No firme documentos importantes ni tome decisiones trascendentes.  Puede reanudar su dieta y sus actividades normales segn le haya indicado el mdico.  Cambie los vendajes (apsitos) tal como se le indic.  Si tiene  preguntas o se le presenta algn problema relacionado con la anestesia general, comunquese con el hospital y pida por el anestesista o anestesilogo de [place]. SOLICITE ATENCIN MDICA SI:  Tiene nuseas y [place] posterior a la anestesia.  Le aparece una erupcin cutnea. SOLICITE ATENCIN MDICA DE INMEDIATO SI:   Tiene dificultad para respirar.  Siente dolor en el pecho.  Tiene algn problema alrgico.   Esta informacin no tiene como fin reemplazar el consejo del mdico. Asegrese de hacerle al mdico cualquier pregunta que tenga.   Document Released: 10/09/2005 Document Revised: 10/30/2014 Elsevier Interactive Patient Education [employer].

## 2015-12-01 ENCOUNTER — Ambulatory Visit: Payer: BLUE CROSS/BLUE SHIELD | Admitting: Student in an Organized Health Care Education/Training Program

## 2015-12-01 ENCOUNTER — Encounter
Admission: RE | Disposition: A | Discharge status: Home / Self Care | Payer: Self-pay | Source: Ambulatory Visit | Attending: Otolaryngology

## 2015-12-01 ENCOUNTER — Ambulatory Visit
Admission: RE | Admit: 2015-12-01 | Discharge: 2015-12-01 | Disposition: A | Discharge status: Home / Self Care | Payer: BLUE CROSS/BLUE SHIELD | Source: Ambulatory Visit | Attending: Otolaryngology | Admitting: Otolaryngology

## 2015-12-01 DIAGNOSIS — I10 Essential (primary) hypertension: Secondary | ICD-10-CM | POA: Insufficient documentation

## 2015-12-01 DIAGNOSIS — R51 Headache: Secondary | ICD-10-CM | POA: Diagnosis not present

## 2015-12-01 DIAGNOSIS — K219 Gastro-esophageal reflux disease without esophagitis: Secondary | ICD-10-CM | POA: Diagnosis not present

## 2015-12-01 DIAGNOSIS — Z79899 Other long term (current) drug therapy: Secondary | ICD-10-CM | POA: Diagnosis not present

## 2015-12-01 DIAGNOSIS — J342 Deviated nasal septum: Secondary | ICD-10-CM | POA: Insufficient documentation

## 2015-12-01 DIAGNOSIS — G709 Myoneural disorder, unspecified: Secondary | ICD-10-CM | POA: Insufficient documentation

## 2015-12-01 DIAGNOSIS — R0602 Shortness of breath: Secondary | ICD-10-CM | POA: Diagnosis not present

## 2015-12-01 DIAGNOSIS — J3489 Other specified disorders of nose and nasal sinuses: Secondary | ICD-10-CM | POA: Insufficient documentation

## 2015-12-01 DIAGNOSIS — Z87442 Personal history of urinary calculi: Secondary | ICD-10-CM | POA: Diagnosis not present

## 2015-12-01 DIAGNOSIS — J343 Hypertrophy of nasal turbinates: Secondary | ICD-10-CM | POA: Diagnosis present

## 2015-12-01 HISTORY — PX: SEPTOPLASTY: SHX2393

## 2015-12-01 HISTORY — PX: TURBINATE REDUCTION: SHX6157

## 2015-12-01 HISTORY — PX: ENDOSCOPIC CONCHA BULLOSA RESECTION: SHX6395

## 2015-12-01 SURGERY — SEPTOPLASTY, NOSE
Anesthesia: General | Site: Nose | Wound class: Clean Contaminated

## 2015-12-01 MED ORDER — OXYCODONE-ACETAMINOPHEN 5-325 MG PO TABS: 1.0000 | ORAL_TABLET | ORAL | Status: DC | PRN

## 2015-12-01 MED ORDER — OXYMETAZOLINE HCL 0.05 % NA SOLN
NASAL | Status: DC | PRN
  Administered 2015-12-01: 1 via TOPICAL

## 2015-12-01 MED ORDER — MIDAZOLAM HCL 5 MG/5ML IJ SOLN
INTRAMUSCULAR | Status: DC | PRN
  Administered 2015-12-01: 2 mg via INTRAVENOUS

## 2015-12-01 MED ORDER — BACITRACIN 500 UNIT/GM EX OINT
TOPICAL_OINTMENT | CUTANEOUS | Status: DC | PRN
  Administered 2015-12-01: 1 via TOPICAL

## 2015-12-01 MED ORDER — LACTATED RINGERS IV SOLN
INTRAVENOUS | Status: DC
  Administered 2015-12-01: 08:00:00 via INTRAVENOUS

## 2015-12-01 MED ORDER — ONDANSETRON HCL 4 MG/2ML IJ SOLN: 4.0000 mg | Freq: Once | INTRAMUSCULAR | Status: DC | PRN

## 2015-12-01 MED ORDER — LIDOCAINE-EPINEPHRINE 1 %-1:100000 IJ SOLN
INTRAMUSCULAR | Status: DC | PRN
  Administered 2015-12-01: 8 mL

## 2015-12-01 MED ORDER — ONDANSETRON HCL 4 MG/2ML IJ SOLN
INTRAMUSCULAR | Status: DC | PRN
  Administered 2015-12-01: 4 mg via INTRAVENOUS

## 2015-12-01 MED ORDER — PROPOFOL 10 MG/ML IV BOLUS
INTRAVENOUS | Status: DC | PRN
  Administered 2015-12-01: 200 mg via INTRAVENOUS

## 2015-12-01 MED ORDER — PROMETHAZINE HCL 12.5 MG PO TABS: 12.5000 mg | ORAL_TABLET | Freq: Four times a day (QID) | ORAL | Status: DC | PRN

## 2015-12-01 MED ORDER — HYDROMORPHONE HCL 1 MG/ML IJ SOLN
0.2500 mg | INTRAMUSCULAR | Status: DC | PRN
  Administered 2015-12-01 (×2): 0.4 mg via INTRAVENOUS
  Administered 2015-12-01: 0.2 mg via INTRAVENOUS
  Administered 2015-12-01: 0.5 mg via INTRAVENOUS

## 2015-12-01 MED ORDER — ACETAMINOPHEN 10 MG/ML IV SOLN
1000.0000 mg | Freq: Once | INTRAVENOUS | Status: AC
  Administered 2015-12-01: 1000 mg via INTRAVENOUS

## 2015-12-01 MED ORDER — PHENYLEPHRINE HCL 10 MG/ML IJ SOLN
INTRAMUSCULAR | Status: DC | PRN
  Administered 2015-12-01 (×3): 50 ug via INTRAVENOUS

## 2015-12-01 MED ORDER — OXYCODONE HCL 5 MG/5ML PO SOLN: 5.0000 mg | Freq: Once | ORAL | Status: AC | PRN

## 2015-12-01 MED ORDER — SULFAMETHOXAZOLE-TRIMETHOPRIM 800-160 MG PO TABS
1.0000 | ORAL_TABLET | Freq: Two times a day (BID) | ORAL | Status: DC
Start: 2015-12-01 — End: 2017-08-30

## 2015-12-01 MED ORDER — DEXAMETHASONE SODIUM PHOSPHATE 4 MG/ML IJ SOLN
INTRAMUSCULAR | Status: DC | PRN
  Administered 2015-12-01: 10 mg via INTRAVENOUS

## 2015-12-01 MED ORDER — ROCURONIUM BROMIDE 100 MG/10ML IV SOLN
INTRAVENOUS | Status: DC | PRN
  Administered 2015-12-01: 30 mg via INTRAVENOUS

## 2015-12-01 MED ORDER — FENTANYL CITRATE (PF) 100 MCG/2ML IJ SOLN
INTRAMUSCULAR | Status: DC | PRN
  Administered 2015-12-01: 100 ug via INTRAVENOUS

## 2015-12-01 MED ORDER — LIDOCAINE HCL (CARDIAC) 20 MG/ML IV SOLN
INTRAVENOUS | Status: DC | PRN
  Administered 2015-12-01: 50 mg via INTRAVENOUS

## 2015-12-01 MED ORDER — OXYCODONE HCL 5 MG PO TABS
5.0000 mg | ORAL_TABLET | Freq: Once | ORAL | Status: AC | PRN
  Administered 2015-12-01: 5 mg via ORAL

## 2015-12-01 MED ORDER — LACTATED RINGERS IV SOLN: 500.0000 mL | INTRAVENOUS | Status: DC

## 2015-12-01 MED ORDER — GLYCOPYRROLATE 0.2 MG/ML IJ SOLN
INTRAMUSCULAR | Status: DC | PRN
  Administered 2015-12-01: .1 mg via INTRAVENOUS

## 2015-12-01 SURGICAL SUPPLY — 27 items
CANISTER SUCT 1200ML W/VALVE (MISCELLANEOUS) ×5 IMPLANT
COAG SUCT 10F 3.5MM HAND CTRL (MISCELLANEOUS) ×5 IMPLANT
DRAPE HEAD BAR (DRAPES) ×5 IMPLANT
DRESSING NASL FOAM PST OP SINU (MISCELLANEOUS) ×3 IMPLANT
DRSG NASAL FOAM POST OP SINU (MISCELLANEOUS) ×5
GLOVE BIO SURGEON STRL SZ7.5 (GLOVE) ×15 IMPLANT
GLOVE BIOGEL PI IND STRL 8 (GLOVE) ×6 IMPLANT
GLOVE BIOGEL PI INDICATOR 8 (GLOVE) ×4
KIT ROOM TURNOVER OR (KITS) ×5 IMPLANT
NEEDLE HYPO 25GX1X1/2 BEV (NEEDLE) IMPLANT
NS IRRIG 500ML POUR BTL (IV SOLUTION) ×5 IMPLANT
PACK DRAPE NASAL/ENT (PACKS) ×5 IMPLANT
PACKING NASAL EPIS 4X2.4 XEROG (MISCELLANEOUS) ×5 IMPLANT
PAD GROUND ADULT SPLIT (MISCELLANEOUS) ×5 IMPLANT
PATTIES SURGICAL .5 X3 (DISPOSABLE) ×5 IMPLANT
SPLINT NASAL SEPTAL PRE-CUT (MISCELLANEOUS) ×5 IMPLANT
STRAP BODY AND KNEE 60X3 (MISCELLANEOUS) ×10 IMPLANT
SUT CHROMIC 4 0 RB 1X27 (SUTURE) ×5 IMPLANT
SUT ETHILON 3-0 FS-10 30 BLK (SUTURE)
SUT ETHILON 4-0 (SUTURE) ×2
SUT ETHILON 4-0 FS2 18XMFL BLK (SUTURE) ×3
SUT PLAIN GUT 4-0 (SUTURE) IMPLANT
SUTURE EHLN 3-0 FS-10 30 BLK (SUTURE) IMPLANT
SUTURE ETHLN 4-0 FS2 18XMF BLK (SUTURE) ×3 IMPLANT
SYR BULB EAR ULCER 3OZ GRN STR (SYRINGE) ×5 IMPLANT
SYRINGE 10CC LL (SYRINGE) IMPLANT
TOWEL OR 17X26 4PK STRL BLUE (TOWEL DISPOSABLE) ×5 IMPLANT

## 2015-12-01 NOTE — Op Note (Signed)
..12/01/2015  10:06 AM    Cristian Pena  LK:[id number]    Pre-Op Dx:  Deviated Nasal Septum, Hypertrophic Inferior Turbinates, Nasal Obstruction, Left Side Concha Bullosa  Post-op Dx: Same  Proc:   1) Septoplasty  2) Bilateral Partial Reduction Inferior Turbinates  3) Endoscopic Left Concha Bullosa Resection  Surg:  Cristian Pena  Anes:  GOT  EBL:  50  Comp:  None  Findings: Bilateral soft tissue and bone inferior turbinate hypertrophy, Significant cartilagenous and bone left sided septal deviation, large obstructive left sided concha bullosa  Procedure: With the patient in a comfortable supine position,  general orotracheal anesthesia was induced without difficulty.  The patient received preoperative Afrin spray for topical decongestion and vasoconstriction.  At an appropriate level, the patient was placed in a semi-sitting position.  Nasal vibrissae were trimmed.   1% Xylocaine with 1:100,000 epinephrine, 8 cc's, was infiltrated into the anterior floor of the nose, into the nasal spine region, into the membranous columella, and finally into the submucoperichondrial plane of the septum on both sides.  Several minutes were allowed for this to take effect.  Cottoniod pledgetts soaked in Afrin were placed into both nasal cavities and left while the patient was prepped and draped in the standard fashion.   A proper time-out was performed.    The materials were removed from the nose and observed to be intact and correct in number.  The nose was inspected with a headlight and zero degree endoscope with the findings as described above.  The inferior turbinates were then inspected.  Under endoscopic visualization, the inferior turbinates were infractured bilaterally with a Soil scientist.  A kelly clamp was attached to the anterior-inferior third of each inferior turbinate for approximately one minute.  Under endoscopic visualization, Tru-cutting forceps were used to remove the  anterior-inferior third of each inferior turbinate.  Electrocautery was used to control bleeding in the area. The remaining turbinate was then outfractured to open up the airway further. There was no significant bleeding noted. The right turbinate was then trimmed and outfractured in a similar fashion.  Next attention was directed to the left sided concha bullosa.  Under endoscopic view with a zero degree sinus endoscope, a Soil scientist was used to sharply make a vertical incision along the face of the left middle turbinate.  This demonstrated a large air pocket within the turbinate.  The lateral border of the inferior turbinate was resected with Grimwald forceps without difficulty and the remaining turbinate was medialized.  Hemostasis was achieved with bovie suction cautery.  Stamberger sinufoam and Xerogel was placed lateral to the middle turbinate as well as along the cut edge of the inferior turbinates bilaterally.  At this time attention was directed to the septoplasty portion of the procedure.  A left Killian incision was sharply executed and carried down to the caudal edge of the quadrangular cartilage with a 15 blade scapel.  A mucoperichondrial flap was elelvated along the quadrangular plate back to the bony-cartilaginous junction using caudal elevator and freer elevator. The mucoperiostium was then elevated along the ethmoid plate and the vomer. An itracartilagenous incision was made using the freer elevator and a contralateral mucoperichondiral flap was elevated using a freer elevator.  Care was taken to avoid any large rents or opposing rents in the mucoperichondrial flap.  Boney spurs of the vomer and maxillary crest were removed with Takahashi forceps.  A small chisel was used to remove obstructive bone along the maxillary crest.  The area of cartilagenous deviation  was removed with combination of freer elevator and Takahashi forceps creating a widely patent nasal cavity as well as  resolution of obstruction from the cartilagenous deviation. The mucosal flaps were placed back into their anatomic position to allow visualization of the airways. The septum now sat in the midline with an improved airway.  A 4-0 Chromic was used to close the Carbon incision as well. Small septal splints were placed anteriorly and a single suture of 2.0 Nylon was used to keep the splints in place with a trans-septal stitch.  The airways were then visualized and showed open passageways on both sides that were significantly improved compared to before surgery.       Dispo:   PACU to home  Plan: Ice, elevation, narcotic analgesia, steroid taper, and prophylactic antibiotics for the duration of indwelling nasal foreign bodies.  We will reevaluate the patient in the office in 6 days and remove the septal splints.  Return to work in 10 days, strenuous activities in two weeks.   Cristian Pena 12/01/2015 10:06 AM

## 2015-12-01 NOTE — Anesthesia Procedure Notes (Addendum)
Procedure Name: Intubation Date/Time: 12/01/2015 9:06 AM Performed by: Mayme Genta Pre-anesthesia Checklist: Patient identified, Emergency Drugs available, Suction available, Patient being monitored and Timeout performed Patient Re-evaluated:Patient Re-evaluated prior to inductionOxygen Delivery Method: Circle system utilized Preoxygenation: Pre-oxygenation with 100% oxygen Intubation Type: IV induction Ventilation: Mask ventilation without difficulty Laryngoscope Size: Miller and 3 Grade View: Grade I Tube type: Oral Rae Tube size: 7.5 mm Number of attempts: 1 Placement Confirmation: ETT inserted through vocal cords under direct vision,  positive ETCO2 and breath sounds checked- equal and bilateral Tube secured with: Tape Dental Injury: Teeth and Oropharynx as per pre-operative assessment

## 2015-12-01 NOTE — Transfer of Care (Signed)
Immediate Anesthesia Transfer of Care Note  Patient: Cristian Pena  Procedure(s) Performed: Procedure(s): SEPTOPLASTY (N/A) TURBINATE REDUCTION (Bilateral) ENDOSCOPIC CONCHA BULLOSA RESECTION (Left)  Patient Location: PACU  Anesthesia Type: General  Level of Consciousness: awake, alert  and patient cooperative  Airway and Oxygen Therapy: Patient Spontanous Breathing and Patient connected to supplemental oxygen  Post-op Assessment: Post-op Vital signs reviewed, Patient's Cardiovascular Status Stable, Respiratory Function Stable, Patent Airway and No signs of Nausea or vomiting  Post-op Vital Signs: Reviewed and stable  Complications: No apparent anesthesia complications

## 2015-12-01 NOTE — H&P (Signed)
..  History and Physical paper copy reviewed and updated date of procedure and will be scanned into system.  

## 2015-12-01 NOTE — Anesthesia Postprocedure Evaluation (Signed)
Anesthesia Post Note  Patient: Cristian Pena  Procedure(s) Performed: Procedure(s) (LRB): SEPTOPLASTY (N/A) TURBINATE REDUCTION (Bilateral) ENDOSCOPIC CONCHA BULLOSA RESECTION (Left)  Patient location during evaluation: PACU Anesthesia Type: MAC Level of consciousness: awake and alert Pain management: pain level controlled Vital Signs Assessment: post-procedure vital signs reviewed and stable Respiratory status: spontaneous breathing, nonlabored ventilation and respiratory function stable Cardiovascular status: blood pressure returned to baseline and stable Postop Assessment: no signs of nausea or vomiting Anesthetic complications: no    Ronnae Kaser D Genowefa Morga

## 2015-12-01 NOTE — Anesthesia Preprocedure Evaluation (Signed)
Anesthesia Evaluation  Patient identified by MRN, date of birth, ID band Patient awake    Reviewed: Allergy & Precautions, H&P , NPO status , Patient's Chart, lab work & pertinent test results, reviewed documented beta blocker date and time   Airway Mallampati: II  TM Distance: >3 FB Neck ROM: full    Dental no notable dental hx.    Pulmonary shortness of breath,    Pulmonary exam normal breath sounds clear to auscultation       Cardiovascular Exercise Tolerance: Good hypertension, negative cardio ROS   Rhythm:regular Rate:Normal     Neuro/Psych  Headaches, PSYCHIATRIC DISORDERS  Neuromuscular disease    GI/Hepatic Neg liver ROS, GERD  Medicated,  Endo/Other  negative endocrine ROS  Renal/GU Kidney stones  negative genitourinary   Musculoskeletal   Abdominal   Peds  Hematology negative hematology ROS (+)   Anesthesia Other Findings   Reproductive/Obstetrics negative OB ROS                             Anesthesia Physical Anesthesia Plan  ASA: II  Anesthesia Plan: General   Post-op Pain Management:    Induction:   Airway Management Planned:   Additional Equipment:   Intra-op Plan:   Post-operative Plan:   Informed Consent: I have reviewed the patients History and Physical, chart, labs and discussed the procedure including the risks, benefits and alternatives for the proposed anesthesia with the patient or authorized representative who has indicated his/her understanding and acceptance.     Plan Discussed with: CRNA  Anesthesia Plan Comments:         Anesthesia Quick Evaluation

## 2015-12-02 ENCOUNTER — Encounter: Payer: Self-pay | Admitting: Otolaryngology

## 2016-08-21 ENCOUNTER — Emergency Department: Payer: BLUE CROSS/BLUE SHIELD

## 2016-08-21 ENCOUNTER — Emergency Department
Admission: EM | Admit: 2016-08-21 | Discharge: 2016-08-21 | Disposition: A | Discharge status: Home / Self Care | Payer: BLUE CROSS/BLUE SHIELD | Attending: Emergency Medicine | Admitting: Emergency Medicine

## 2016-08-21 ENCOUNTER — Encounter: Payer: Self-pay | Admitting: Emergency Medicine

## 2016-08-21 DIAGNOSIS — I10 Essential (primary) hypertension: Secondary | ICD-10-CM | POA: Insufficient documentation

## 2016-08-21 DIAGNOSIS — Z79899 Other long term (current) drug therapy: Secondary | ICD-10-CM | POA: Insufficient documentation

## 2016-08-21 DIAGNOSIS — N200 Calculus of kidney: Secondary | ICD-10-CM | POA: Insufficient documentation

## 2016-08-21 LAB — BASIC METABOLIC PANEL
ANION GAP: 5 (ref 5–15)
BUN: 20 mg/dL (ref 6–20)
CO2: 27 mmol/L (ref 22–32)
Calcium: 8.8 mg/dL — ABNORMAL LOW (ref 8.9–10.3)
Chloride: 106 mmol/L (ref 101–111)
Creatinine, Ser: 0.98 mg/dL (ref 0.61–1.24)
GFR calc Af Amer: >60 mL/min (ref >60)
Glucose, Bld: 131 mg/dL — ABNORMAL HIGH (ref 65–99)
POTASSIUM: 3.8 mmol/L (ref 3.5–5.1)
SODIUM: 138 mmol/L (ref 135–145)

## 2016-08-21 LAB — CBC
HEMATOCRIT: 47.4 % (ref 40.0–52.0)
HEMOGLOBIN: 16.2 g/dL (ref 13.0–18.0)
MCH: 28.7 pg (ref 26.0–34.0)
MCHC: 34.2 g/dL (ref 32.0–36.0)
MCV: 84 fL (ref 80.0–100.0)
Platelets: 202 10*3/uL (ref 150–440)
RBC: 5.65 MIL/uL (ref 4.40–5.90)
RDW: 13.9 % (ref 11.5–14.5)
WBC: 8.3 10*3/uL (ref 3.8–10.6)

## 2016-08-21 LAB — URINALYSIS COMPLETE WITH MICROSCOPIC (ARMC ONLY)
Bilirubin Urine: NEGATIVE
Glucose, UA: NEGATIVE mg/dL
KETONES UR: NEGATIVE mg/dL
LEUKOCYTES UA: NEGATIVE
Nitrite: NEGATIVE
PH: 5 (ref 5.0–8.0)
PROTEIN: 30 mg/dL — AB
SPECIFIC GRAVITY, URINE: 1.025 (ref 1.005–1.030)

## 2016-08-21 MED ORDER — HYDROMORPHONE HCL 1 MG/ML IJ SOLN
1.0000 mg | Freq: Once | INTRAMUSCULAR | Status: AC
  Administered 2016-08-21: 1 mg via INTRAVENOUS
  Filled 2016-08-21: qty 1

## 2016-08-21 MED ORDER — OXYCODONE-ACETAMINOPHEN 5-325 MG PO TABS: 1.0000 | ORAL_TABLET | ORAL | 0 refills | Status: DC | PRN

## 2016-08-21 MED ORDER — GI COCKTAIL ~~LOC~~
30.0000 mL | Freq: Once | ORAL | Status: AC
  Administered 2016-08-21: 30 mL via ORAL

## 2016-08-21 MED ORDER — ONDANSETRON HCL 4 MG/2ML IJ SOLN
4.0000 mg | Freq: Once | INTRAMUSCULAR | Status: DC
Start: 2016-08-21 — End: 2016-08-21

## 2016-08-21 MED ORDER — MORPHINE SULFATE (PF) 2 MG/ML IV SOLN: 4.0000 mg | Freq: Once | INTRAVENOUS | Status: DC

## 2016-08-21 MED ORDER — ONDANSETRON HCL 4 MG/2ML IJ SOLN
4.0000 mg | Freq: Once | INTRAMUSCULAR | Status: AC
  Administered 2016-08-21: 4 mg via INTRAVENOUS

## 2016-08-21 MED ORDER — ONDANSETRON HCL 4 MG/2ML IJ SOLN
INTRAMUSCULAR | Status: AC
  Filled 2016-08-21: qty 2

## 2016-08-21 MED ORDER — MORPHINE SULFATE (PF) 2 MG/ML IV SOLN
4.0000 mg | Freq: Once | INTRAVENOUS | Status: AC
  Administered 2016-08-21: 4 mg via INTRAVENOUS

## 2016-08-21 MED ORDER — KETOROLAC TROMETHAMINE 30 MG/ML IJ SOLN
30.0000 mg | Freq: Once | INTRAMUSCULAR | Status: AC
  Administered 2016-08-21: 30 mg via INTRAVENOUS
  Filled 2016-08-21: qty 1

## 2016-08-21 MED ORDER — TAMSULOSIN HCL 0.4 MG PO CAPS
0.4000 mg | ORAL_CAPSULE | Freq: Once | ORAL | Status: AC
  Administered 2016-08-21: 0.4 mg via ORAL
  Filled 2016-08-21: qty 1

## 2016-08-21 MED ORDER — SODIUM CHLORIDE 0.9 % IV BOLUS (SEPSIS)
1000.0000 mL | Freq: Once | INTRAVENOUS | Status: AC
  Administered 2016-08-21: 1000 mL via INTRAVENOUS

## 2016-08-21 MED ORDER — MORPHINE SULFATE (PF) 2 MG/ML IV SOLN
INTRAVENOUS | Status: AC
  Filled 2016-08-21: qty 2

## 2016-08-21 MED ORDER — GI COCKTAIL ~~LOC~~
ORAL | Status: AC
  Administered 2016-08-21: 30 mL via ORAL
  Filled 2016-08-21: qty 30

## 2016-08-21 MED ORDER — ONDANSETRON HCL 4 MG/2ML IJ SOLN
4.0000 mg | Freq: Once | INTRAMUSCULAR | Status: AC
  Administered 2016-08-21: 4 mg via INTRAVENOUS
  Filled 2016-08-21: qty 2

## 2016-08-21 NOTE — ED Notes (Signed)
MD at bedside. 
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
## 2016-08-21 NOTE — ED Notes (Signed)
Pt discharged home after verbalizing understanding of discharge instructions; nad noted. 

## 2016-08-21 NOTE — ED Triage Notes (Signed)
Patient states that he was awaken with left side flank pain and nausea. Patient reports that he has a history of kidney stones and this is the same pain.

## 2017-07-23 ENCOUNTER — Emergency Department: Payer: Self-pay

## 2017-07-23 ENCOUNTER — Encounter: Payer: Self-pay | Admitting: Emergency Medicine

## 2017-07-23 ENCOUNTER — Emergency Department
Admission: EM | Admit: 2017-07-23 | Discharge: 2017-07-23 | Disposition: A | Discharge status: Home / Self Care | Payer: Self-pay | Attending: Emergency Medicine | Admitting: Emergency Medicine

## 2017-07-23 DIAGNOSIS — R110 Nausea: Secondary | ICD-10-CM | POA: Insufficient documentation

## 2017-07-23 DIAGNOSIS — R1013 Epigastric pain: Secondary | ICD-10-CM | POA: Insufficient documentation

## 2017-07-23 DIAGNOSIS — Z79899 Other long term (current) drug therapy: Secondary | ICD-10-CM | POA: Insufficient documentation

## 2017-07-23 DIAGNOSIS — I10 Essential (primary) hypertension: Secondary | ICD-10-CM | POA: Insufficient documentation

## 2017-07-23 LAB — COMPREHENSIVE METABOLIC PANEL
ALK PHOS: 88 U/L (ref 38–126)
ALT: 32 U/L (ref 17–63)
AST: 25 U/L (ref 15–41)
Albumin: 4.2 g/dL (ref 3.5–5.0)
Anion gap: 8 (ref 5–15)
BUN: 12 mg/dL (ref 6–20)
CALCIUM: 9 mg/dL (ref 8.9–10.3)
CHLORIDE: 104 mmol/L (ref 101–111)
CO2: 27 mmol/L (ref 22–32)
CREATININE: 0.94 mg/dL (ref 0.61–1.24)
Glucose, Bld: 99 mg/dL (ref 65–99)
Potassium: 4.5 mmol/L (ref 3.5–5.1)
Sodium: 139 mmol/L (ref 135–145)
Total Bilirubin: 0.4 mg/dL (ref 0.3–1.2)
Total Protein: 7 g/dL (ref 6.5–8.1)

## 2017-07-23 LAB — LIPASE, BLOOD: LIPASE: 26 U/L (ref 11–51)

## 2017-07-23 LAB — CBC
HCT: 47.7 % (ref 40.0–52.0)
Hemoglobin: 16.3 g/dL (ref 13.0–18.0)
MCH: 29.2 pg (ref 26.0–34.0)
MCHC: 34.2 g/dL (ref 32.0–36.0)
MCV: 85.3 fL (ref 80.0–100.0)
PLATELETS: 227 10*3/uL (ref 150–440)
RBC: 5.59 MIL/uL (ref 4.40–5.90)
RDW: 13.4 % (ref 11.5–14.5)
WBC: 7.1 10*3/uL (ref 3.8–10.6)

## 2017-07-23 MED ORDER — ONDANSETRON 4 MG PO TBDP: 4.0000 mg | ORAL_TABLET | Freq: Three times a day (TID) | ORAL | 0 refills | Status: DC | PRN

## 2017-07-23 MED ORDER — ONDANSETRON HCL 4 MG/2ML IJ SOLN
4.0000 mg | Freq: Once | INTRAMUSCULAR | Status: AC
  Administered 2017-07-23: 4 mg via INTRAVENOUS
  Filled 2017-07-23: qty 2

## 2017-07-23 MED ORDER — HYDROCODONE-ACETAMINOPHEN 5-325 MG PO TABS
1.0000 | ORAL_TABLET | Freq: Once | ORAL | Status: AC
  Administered 2017-07-23: 1 via ORAL

## 2017-07-23 MED ORDER — HYDROCODONE-ACETAMINOPHEN 5-325 MG PO TABS
ORAL_TABLET | ORAL | Status: AC
  Filled 2017-07-23: qty 1

## 2017-07-23 MED ORDER — MORPHINE SULFATE (PF) 4 MG/ML IV SOLN
4.0000 mg | Freq: Once | INTRAVENOUS | Status: AC
  Administered 2017-07-23: 4 mg via INTRAVENOUS
  Filled 2017-07-23: qty 1

## 2017-07-23 MED ORDER — IOPAMIDOL (ISOVUE-300) INJECTION 61%
100.0000 mL | Freq: Once | INTRAVENOUS | Status: AC | PRN
  Administered 2017-07-23: 100 mL via INTRAVENOUS
  Filled 2017-07-23: qty 100

## 2017-07-23 NOTE — ED Triage Notes (Signed)
Pt with abd pain for over a week with some diarrhea, says he has been breaking out in sweats and having chills.

## 2017-07-23 NOTE — Discharge Instructions (Signed)
Please keep your appointment with your primary care physician tomorrow at 5 PM as scheduled and return to the emergency department for any concerns.  It was a pleasure to take care of you today, and thank you for coming to our emergency department.  If you have any questions or concerns before leaving please ask the nurse to grab me and I'm more than happy to go through your aftercare instructions again.  If you were prescribed any opioid pain medication today such as Norco, Vicodin, Percocet, morphine, hydrocodone, or oxycodone please make sure you do not drive when you are taking this medication as it can alter your ability to drive safely.  If you have any concerns once you are home that you are not improving or are in fact getting worse before you can make it to your follow-up appointment, please do not hesitate to call 911 and come back for further evaluation.  Darel Hong, MD  Results for orders placed or performed during the hospital encounter of 07/23/17  Lipase, blood  Result Value Ref Range   Lipase 26 11 - 51 U/L  Comprehensive metabolic panel  Result Value Ref Range   Sodium 139 135 - 145 mmol/L   Potassium 4.5 3.5 - 5.1 mmol/L   Chloride 104 101 - 111 mmol/L   CO2 27 22 - 32 mmol/L   Glucose, Bld 99 65 - 99 mg/dL   BUN 12 6 - 20 mg/dL   Creatinine, Ser 0.94 0.61 - 1.24 mg/dL   Calcium 9.0 8.9 - 10.3 mg/dL   Total Protein 7.0 6.5 - 8.1 g/dL   Albumin 4.2 3.5 - 5.0 g/dL   AST 25 15 - 41 U/L   ALT 32 17 - 63 U/L   Alkaline Phosphatase 88 38 - 126 U/L   Total Bilirubin 0.4 0.3 - 1.2 mg/dL   GFR calc non Af Amer >60 >60 mL/min   GFR calc Af Amer >60 >60 mL/min   Anion gap 8 5 - 15  CBC  Result Value Ref Range   WBC 7.1 3.8 - 10.6 K/uL   RBC 5.59 4.40 - 5.90 MIL/uL   Hemoglobin 16.3 13.0 - 18.0 g/dL   HCT 47.7 40.0 - 52.0 %   MCV 85.3 80.0 - 100.0 fL   MCH 29.2 26.0 - 34.0 pg   MCHC 34.2 32.0 - 36.0 g/dL   RDW 13.4 11.5 - 14.5 %   Platelets 227 150 - 440 K/uL    Ct Abdomen Pelvis W Contrast  Result Date: 07/23/2017 CLINICAL DATA:  Patient with diffuse abdominal pain and diarrhea for 1 week. EXAM: CT ABDOMEN AND PELVIS WITH CONTRAST TECHNIQUE: Multidetector CT imaging of the abdomen and pelvis was performed using the standard protocol following bolus administration of intravenous contrast. CONTRAST:  152mL ISOVUE-300 IOPAMIDOL (ISOVUE-300) INJECTION 61% COMPARISON:  CT abdomen pelvis 08/21/2016 FINDINGS: Lower chest: Normal heart size. No pericardial effusion. Dependent atelectasis within the lower lobes bilaterally. No pleural effusion. Hepatobiliary: The liver is normal in size and contour. No focal lesion identified. Gallbladder is unremarkable. No intrahepatic or extrahepatic biliary ductal dilatation. Pancreas: Unremarkable Spleen: Unremarkable Adrenals/Urinary Tract: Normal adrenal glands. Kidneys are lobular in contour most compatible with prior renal scarring. Adjacent 2 mm stones within the interpolar region of left kidney. No hydronephrosis. No ureterolithiasis. Urinary bladder is unremarkable. Stomach/Bowel: No abnormal bowel wall thickening or evidence for bowel obstruction. No free fluid or free intraperitoneal air. Normal morphology of the stomach. Normal appendix. Vascular/Lymphatic: Normal caliber abdominal aorta.  No retroperitoneal lymphadenopathy. Reproductive: Central dystrophic calcifications in the prostate. Other: Bilateral fat containing inguinal hernias, right-greater-than-left. Musculoskeletal: No aggressive or acute appearing osseous lesions. IMPRESSION: No acute process within the abdomen or pelvis. Left-sided nephrolithiasis. Electronically Signed   By: Lovey Newcomer M.D.   On: 07/23/2017 18:43
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
## 2017-08-07 ENCOUNTER — Emergency Department
Admission: EM | Admit: 2017-08-07 | Discharge: 2017-08-07 | Disposition: A | Discharge status: Home / Self Care | Payer: Self-pay | Attending: Student in an Organized Health Care Education/Training Program | Admitting: Student in an Organized Health Care Education/Training Program

## 2017-08-07 ENCOUNTER — Encounter: Payer: Self-pay | Admitting: Emergency Medicine

## 2017-08-07 DIAGNOSIS — I10 Essential (primary) hypertension: Secondary | ICD-10-CM | POA: Insufficient documentation

## 2017-08-07 DIAGNOSIS — Z79899 Other long term (current) drug therapy: Secondary | ICD-10-CM | POA: Insufficient documentation

## 2017-08-07 DIAGNOSIS — K645 Perianal venous thrombosis: Secondary | ICD-10-CM | POA: Insufficient documentation

## 2017-08-07 MED ORDER — MORPHINE SULFATE (PF) 4 MG/ML IV SOLN
8.0000 mg | INTRAVENOUS | Status: DC | PRN
  Administered 2017-08-07: 8 mg via INTRAMUSCULAR
  Filled 2017-08-07: qty 2

## 2017-08-07 MED ORDER — TRAMADOL HCL 50 MG PO TABS
50.0000 mg | ORAL_TABLET | Freq: Four times a day (QID) | ORAL | 0 refills | Status: DC | PRN
Start: 2017-08-07 — End: 2017-08-30

## 2017-08-07 MED ORDER — BUPIVACAINE HCL (PF) 0.5 % IJ SOLN
30.0000 mL | Freq: Once | INTRAMUSCULAR | Status: AC
  Administered 2017-08-07: 30 mL

## 2017-08-07 MED ORDER — BUPIVACAINE HCL (PF) 0.5 % IJ SOLN
INTRAMUSCULAR | Status: AC
  Administered 2017-08-07: 30 mL
  Filled 2017-08-07: qty 30

## 2017-08-07 MED ORDER — LIDOCAINE HCL (PF) 1 % IJ SOLN
INTRAMUSCULAR | Status: AC
  Administered 2017-08-07: 5 mL via INTRADERMAL
  Filled 2017-08-07: qty 5

## 2017-08-07 MED ORDER — POLYETHYLENE GLYCOL 3350 17 G PO PACK: 17.0000 g | PACK | Freq: Every day | ORAL | 0 refills | Status: DC

## 2017-08-07 MED ORDER — LIDOCAINE HCL (PF) 1 % IJ SOLN
5.0000 mL | Freq: Once | INTRAMUSCULAR | Status: AC
  Administered 2017-08-07: 5 mL via INTRADERMAL

## 2017-08-07 MED ORDER — ONDANSETRON 4 MG PO TBDP
4.0000 mg | ORAL_TABLET | Freq: Once | ORAL | Status: AC
  Administered 2017-08-07: 4 mg via ORAL
  Filled 2017-08-07: qty 1

## 2017-08-07 MED ORDER — SITZ BATH MISC: 1.0000 "application " | Freq: Every day | 0 refills | Status: DC

## 2017-08-07 MED ORDER — DIBUCAINE 1 % RE OINT: 1.0000 "application " | TOPICAL_OINTMENT | RECTAL | 0 refills | Status: DC | PRN
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
## 2017-08-07 NOTE — Discharge Instructions (Signed)
Return immediately for intractable pain, fevers or for any additional concerns or questions

## 2017-08-07 NOTE — ED Triage Notes (Signed)
Pt sent over for further eval of hemorrhoids. Has been treated with cream and suppositories with no relief. Pt states pain has increased and he wants it cut out.

## 2017-08-30 ENCOUNTER — Emergency Department: Payer: No Typology Code available for payment source

## 2017-08-30 ENCOUNTER — Emergency Department
Admission: EM | Admit: 2017-08-30 | Discharge: 2017-08-30 | Disposition: A | Discharge status: Home / Self Care | Payer: No Typology Code available for payment source | Attending: Emergency Medicine | Admitting: Emergency Medicine

## 2017-08-30 DIAGNOSIS — Y929 Unspecified place or not applicable: Secondary | ICD-10-CM | POA: Diagnosis not present

## 2017-08-30 DIAGNOSIS — Y939 Activity, unspecified: Secondary | ICD-10-CM | POA: Insufficient documentation

## 2017-08-30 DIAGNOSIS — I10 Essential (primary) hypertension: Secondary | ICD-10-CM | POA: Insufficient documentation

## 2017-08-30 DIAGNOSIS — S199XXA Unspecified injury of neck, initial encounter: Secondary | ICD-10-CM | POA: Diagnosis present

## 2017-08-30 DIAGNOSIS — Z79899 Other long term (current) drug therapy: Secondary | ICD-10-CM | POA: Insufficient documentation

## 2017-08-30 DIAGNOSIS — Y999 Unspecified external cause status: Secondary | ICD-10-CM | POA: Insufficient documentation

## 2017-08-30 DIAGNOSIS — S161XXA Strain of muscle, fascia and tendon at neck level, initial encounter: Secondary | ICD-10-CM | POA: Insufficient documentation

## 2017-08-30 MED ORDER — TRAMADOL HCL 50 MG PO TABS: 50.0000 mg | ORAL_TABLET | Freq: Four times a day (QID) | ORAL | 0 refills | Status: AC | PRN

## 2017-08-30 MED ORDER — OXYCODONE-ACETAMINOPHEN 5-325 MG PO TABS
1.0000 | ORAL_TABLET | Freq: Once | ORAL | Status: AC
  Administered 2017-08-30: 1 via ORAL
  Filled 2017-08-30: qty 1
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
## 2017-08-30 NOTE — Discharge Instructions (Signed)
Follow-up with your primary care provider if any continued problems. Take medication only as directed. Do not drive or operate machinery while taking this medication. Use ice or heat to your neck and back as needed.

## 2017-08-30 NOTE — ED Notes (Signed)
Pt discharged home after verbalizing understanding of discharge instructions; nad noted. 
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
## 2019-03-09 IMAGING — CT CT ABD-PELV W/ CM
2 of 5 series · 16 of 46 positions shown, 18 images · IV contrast (APPLIED)
Comparison: CT abdomen pelvis 08/21/2016

CLINICAL DATA: Patient with diffuse abdominal pain and diarrhea for
1 week.

EXAM:
CT ABDOMEN AND PELVIS WITH CONTRAST
TECHNIQUE: Multidetector CT imaging of the abdomen and pelvis was performed
using the standard protocol following bolus administration of
intravenous contrast.
CONTRAST:  100mL LA1FBP-H88 IOPAMIDOL (LA1FBP-H88) INJECTION 61%

[Series 2: routine abd/pel with · axial · 0.78mm/px · z∈[-472,-22]mm · 13 of 102 slices shown, 15 images]
[im 6/102  soft-tissue]
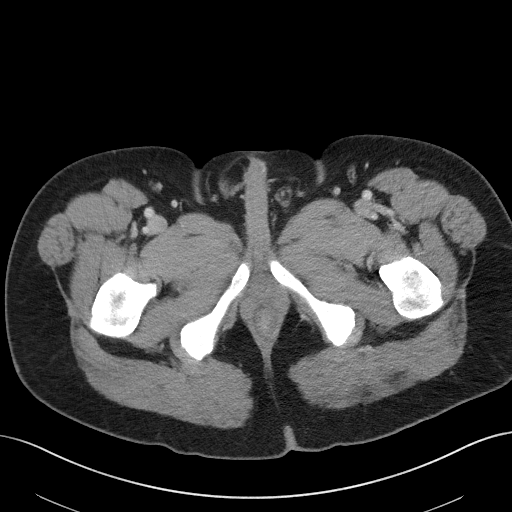
[im 6/102  bone]
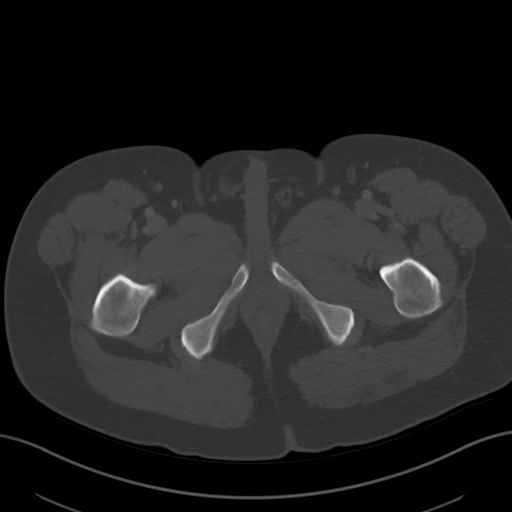
[im 12/102  soft-tissue]
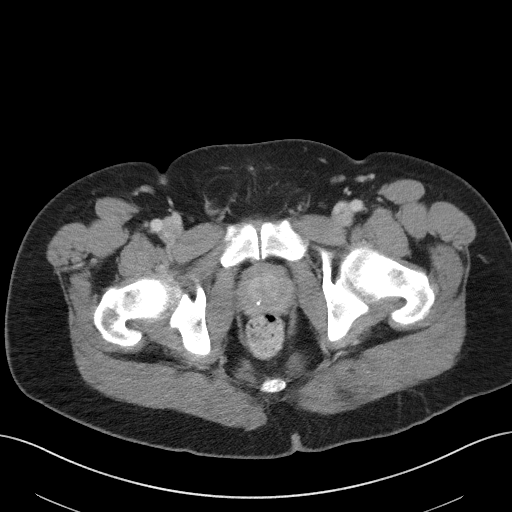
[im 23/102  soft-tissue]
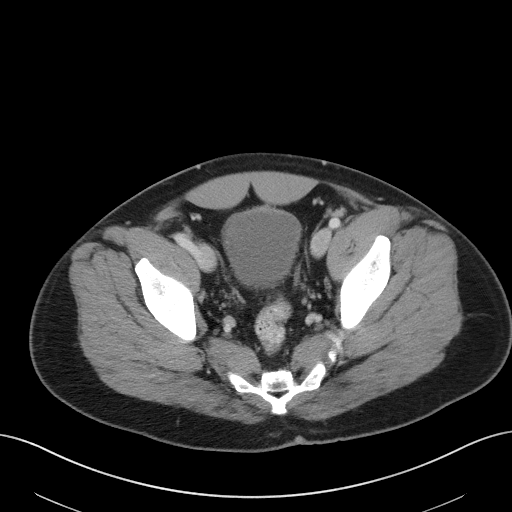
[im 29/102  soft-tissue]
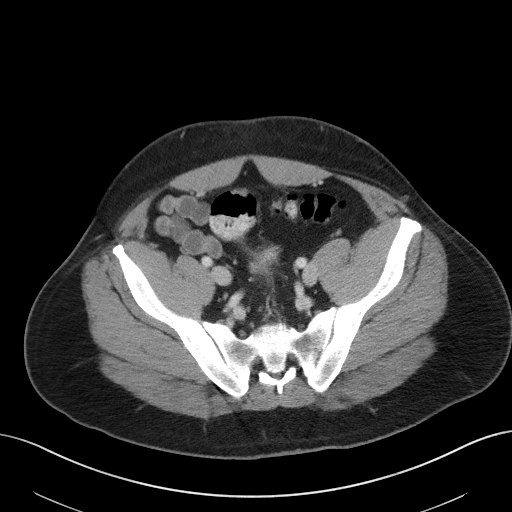
[im 34/102  soft-tissue]
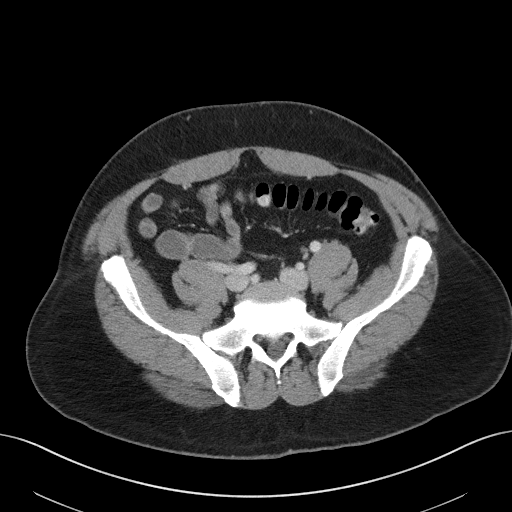
[im 45/102  soft-tissue]
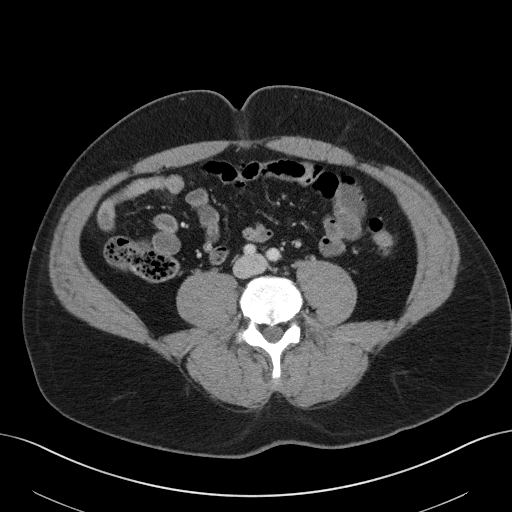
[im 51/102  soft-tissue]
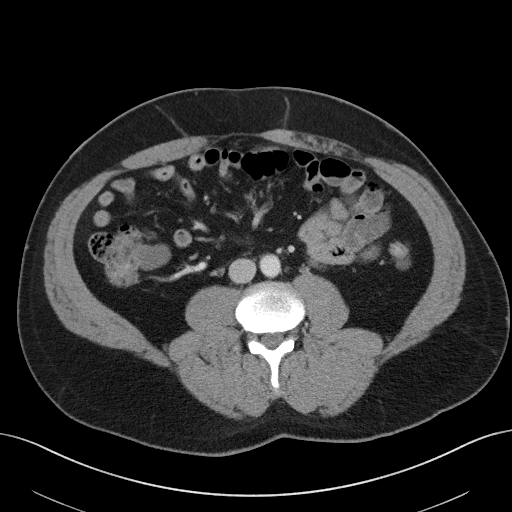
[im 57/102  soft-tissue]
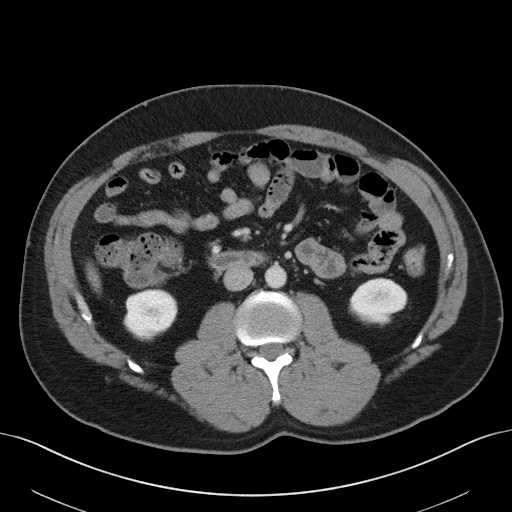
[im 68/102  soft-tissue]
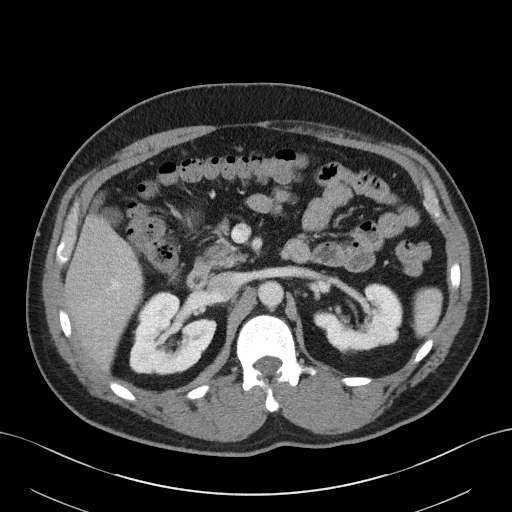
[im 68/102  bone]
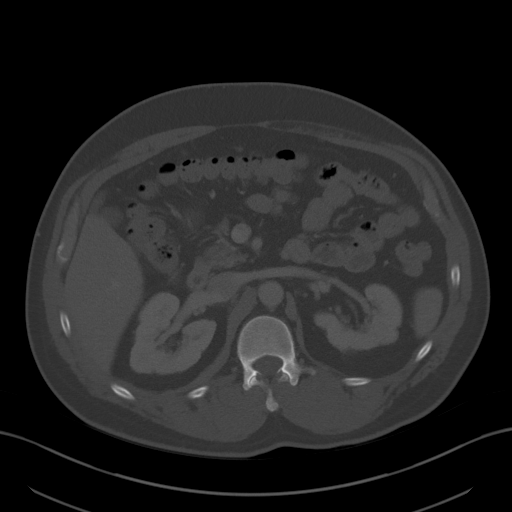
[im 73/102  soft-tissue]
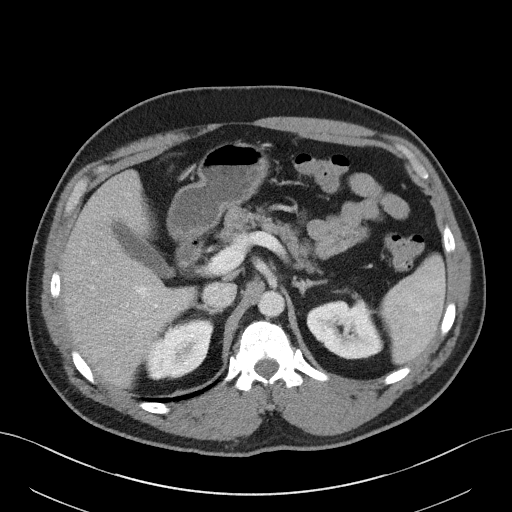
[im 79/102  soft-tissue]
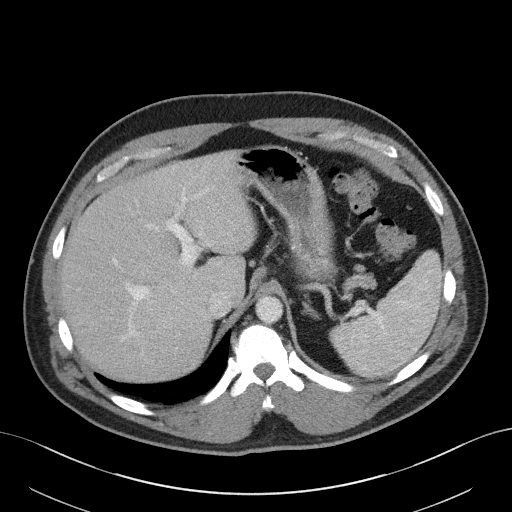
[im 90/102  soft-tissue]
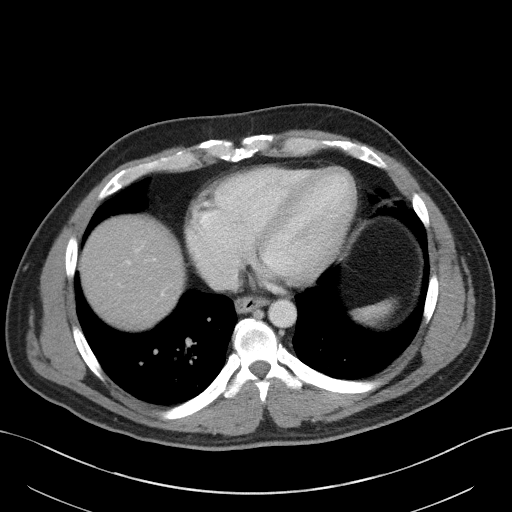
[im 96/102  soft-tissue]
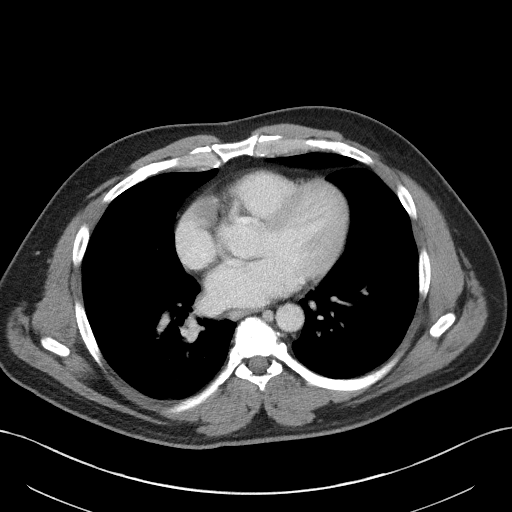

[Series 5: coronal st · coronal · 0.73mm/px · 3 of 96 slices shown]
[im 32/96  soft-tissue]
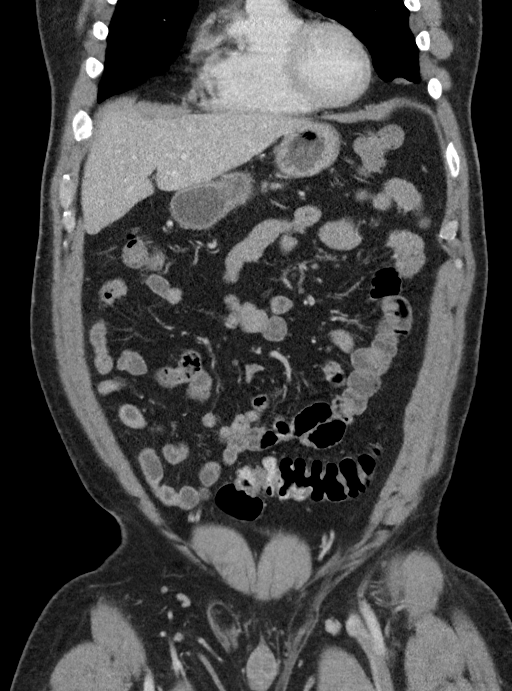
[im 43/96  soft-tissue]
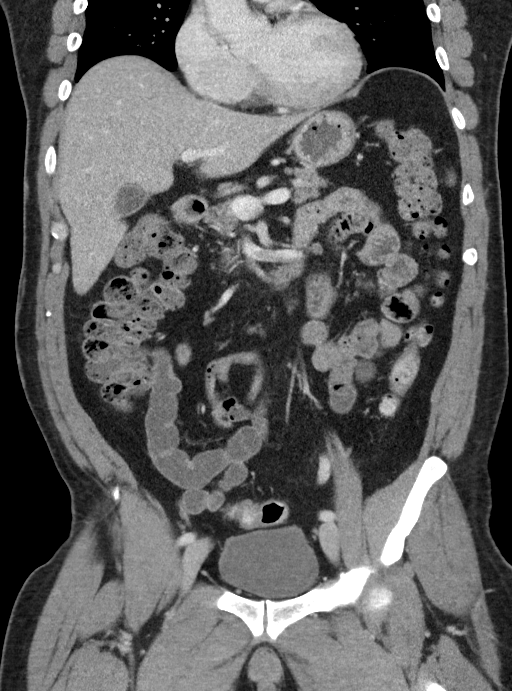
[im 53/96  soft-tissue]
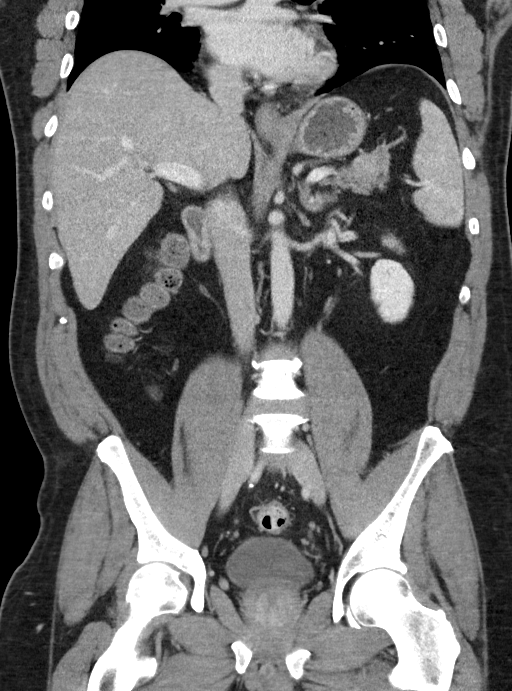

[16 of 46 positions shown; findings below may reference images not displayed]

FINDINGS: Lower chest: Normal heart size. No pericardial effusion. Dependent
atelectasis within the lower lobes bilaterally. No pleural effusion.

Hepatobiliary: The liver is normal in size and contour. No focal
lesion identified. Gallbladder is unremarkable. No intrahepatic or
extrahepatic biliary ductal dilatation.

Pancreas: Unremarkable

Spleen: Unremarkable

Adrenals/Urinary Tract: Normal adrenal glands. Kidneys are lobular
in contour most compatible with prior renal scarring. Adjacent 2 mm
stones within the interpolar region of left kidney. No
hydronephrosis. No ureterolithiasis. Urinary bladder is
unremarkable.

Stomach/Bowel: No abnormal bowel wall thickening or evidence for
bowel obstruction. No free fluid or free intraperitoneal air. Normal
morphology of the stomach. Normal appendix.

Vascular/Lymphatic: Normal caliber abdominal aorta. No
retroperitoneal lymphadenopathy.

Reproductive: Central dystrophic calcifications in the prostate.

Other: Bilateral fat containing inguinal hernias,
right-greater-than-left.

Musculoskeletal: No aggressive or acute appearing osseous lesions.
IMPRESSION: No acute process within the abdomen or pelvis.

Left-sided nephrolithiasis.

## 2020-07-22 ENCOUNTER — Other Ambulatory Visit: Payer: Self-pay

## 2020-07-22 ENCOUNTER — Ambulatory Visit (INDEPENDENT_AMBULATORY_CARE_PROVIDER_SITE_OTHER): Payer: Managed Care, Other (non HMO) | Admitting: Urology

## 2020-07-22 ENCOUNTER — Encounter: Payer: Self-pay | Admitting: Urology

## 2020-07-22 VITALS — BP 121/82 | HR 90 | Ht 66.0 in | Wt 189.0 lb

## 2020-07-22 DIAGNOSIS — N411 Chronic prostatitis: Secondary | ICD-10-CM | POA: Diagnosis not present

## 2020-07-22 DIAGNOSIS — I861 Scrotal varices: Secondary | ICD-10-CM | POA: Diagnosis not present

## 2020-07-22 DIAGNOSIS — N529 Male erectile dysfunction, unspecified: Secondary | ICD-10-CM | POA: Diagnosis not present

## 2020-07-22 DIAGNOSIS — Z8042 Family history of malignant neoplasm of prostate: Secondary | ICD-10-CM

## 2020-07-22 DIAGNOSIS — R310 Gross hematuria: Secondary | ICD-10-CM | POA: Diagnosis not present

## 2020-07-22 LAB — URINALYSIS, COMPLETE
Bilirubin, UA: NEGATIVE
Glucose, UA: NEGATIVE
Ketones, UA: NEGATIVE
Leukocytes,UA: NEGATIVE
Nitrite, UA: NEGATIVE
Protein,UA: NEGATIVE
RBC, UA: NEGATIVE
Specific Gravity, UA: 1.02 (ref 1.005–1.030)
Urobilinogen, Ur: 0.2 mg/dL (ref 0.2–1.0)
pH, UA: 7 (ref 5.0–7.5)

## 2020-07-22 LAB — MICROSCOPIC EXAMINATION
Bacteria, UA: NONE SEEN
Epithelial Cells (non renal): NONE SEEN /hpf (ref 0–10)
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
## 2020-07-22 NOTE — Patient Instructions (Signed)
Cistoscopia  Cystoscopy  La cistoscopia es un procedimiento que se utiliza para ayudar a diagnosticar y, a veces, tratar afecciones que afectan las vías urinarias inferiores. Las vías urinarias inferiores incluyen la vejiga y la uretra. La uretra es el conducto por el que drena la orina de la vejiga. La cistoscopia se hace con un instrumento fino en forma de tubo con una luz y una cámara en el extremo (cistoscopio). El cistoscopio puede ser duro o flexible, según el objetivo del procedimiento. El cistoscopio se inserta por la uretra e ingresa a la vejiga.  La cistoscopia se puede recomendar en los siguientes casos:  · Infecciones de las vías urinarias que se repiten.  · Sangre en la orina (hematuria).  · Incapacidad para controlar la orina (incontinencia urinaria) o vejiga hiperactiva.  · Células inusuales que se encuentran en una muestra de orina.  · Una obstrucción en la uretra, como un cálculo urinario.  · Dolor al orinar.  · Una anormalidad en la vejiga que se encuentra durante una pielografía intravenosa (PIV) o exploración por tomografía computarizada (TC).  La cistoscopia también puede realizarse para extraer una muestra de tejido para examinarla con un microscopio (biopsia).  Informe al médico acerca de lo siguiente:  · Cualquier alergia que tenga.  · Todos los medicamentos que utiliza, incluidos vitaminas, hierbas, gotas oftálmicas, cremas y medicamentos de venta libre.  · Cualquier problema previo que usted o algún miembro de su familia hayan tenido con los anestésicos.  · Cualquier trastorno de la sangre que tenga.  · Cirugías a las que se haya sometido.  · Cualquier afección médica que tenga.  · Si está embarazada o podría estarlo.  ¿Cuáles son los riesgos?  En general, se trata de un procedimiento seguro. Sin embargo, pueden ocurrir complicaciones, por ejemplo:  · Infección.  · Sangrado.  · Reacciones alérgicas a los medicamentos.  · Daños a otras estructuras u órganos.  ¿Qué ocurre antes del  procedimiento?  · Consulte al médico sobre:  ? Cambiar o suspender los medicamentos que toma habitualmente. Esto es muy importante si toma medicamentos para la diabetes o anticoagulantes.  ? Tomar medicamentos como aspirina e ibuprofeno. Estos medicamentos pueden tener un efecto anticoagulante en la sangre. No tome estos medicamentos a menos que el médico se lo indique.  ? Tomar medicamentos de venta libre, vitaminas, hierbas y suplementos.  · Siga las instrucciones del médico respecto de las restricciones en las comidas o las bebidas.  · Pregúntele al médico qué medidas se tomarán para ayudar a prevenir una infección. Estas medidas pueden incluir:  ? Lavar la piel con un jabón antiséptico.  ? Suministrar antibióticos.  · Pueden hacerle un examen o estudios, tales como:  ? Radiografías de la vejiga, la uretra o los riñones.  ? Análisis de orina para verificar si hay signos de infección.  · Haga que alguien lo lleve a su casa desde el hospital o la clínica.  ¿Qué ocurre durante el procedimiento?       · Le administrarán uno o más de los siguientes medicamentos:  ? Un medicamento para ayudar a relajarse (sedante).  ? Un medicamento para adormecer la zona (anestesia local).  · Se limpiará la zona que se encuentra alrededor de la abertura de la uretra.  · El cistoscopio se introducirá por la uretra e ingresará a la vejiga.  · Un líquido estéril fluirá por el cistoscopio y rellenará la vejiga. El líquido estirará la vejiga para que el médico pueda examinar claramente las paredes de la   vejiga.  · El médico examinará la uretra y la vejiga. El médico puede tomar una biopsia o extraer cálculos.  · El cistoscopio se retirará y la vejiga se vaciará.  El procedimiento puede variar según el médico y el hospital.  ¿Qué puedo esperar después del procedimiento?  Después del procedimiento, es común tener los siguientes síntomas:  · Algo de dolor o molestias en el abdomen y la uretra.  · Síntomas urinarios. Estos incluyen los  siguientes:  ? Dolor o ardor leves al orinar. El dolor debe ceder unos minutos después de orinar. Esto puede durar 1 semana.  ? Una pequeña cantidad de sangre en la orina durante varios días.  ? Sentir que necesita orinar pero produce solo una pequeña cantidad de orina.  Siga estas instrucciones en su casa:  Medicamentos  · Tome los medicamentos de venta libre y los recetados solamente como se lo haya indicado el médico.  · Si le recetaron un antibiótico, tómelo como se lo haya indicado el médico. No deje de tomar el antibiótico, aunque comience a sentirse mejor.  Instrucciones generales  · Retome sus actividades normales según lo indicado por el médico. Pregúntele al médico qué actividades son seguras para usted.  · No conduzca durante 24 horas si le administraron un sedante durante el procedimiento.  · Observe si hay sangre en la orina. Si la cantidad de sangre en la orina aumenta, comuníquese con el médico.  · Siga las instrucciones del médico respecto de las restricciones en las comidas o las bebidas.  · Si se tomó una muestra de tejido para análisis (biopsia) durante el procedimiento, depende de usted obtener los resultados de la prueba. Consulte al médico o pregunte en el departamento donde se realiza la prueba cuándo estarán listos los resultados.  · Beba suficiente líquido como para mantener la orina de color amarillo pálido.  · Concurra a todas las visitas de seguimiento como se lo haya indicado el médico. Esto es importante.  Comuníquese con un médico si:  · Tiene un dolor que empeora o que no mejora con los medicamentos, especialmente al orinar.  · Tiene problemas para orinar.  · Observa más sangre en la orina.  Solicite ayuda inmediatamente si:  · Hay coágulos de sangre en la orina.  · Tiene dolor abdominal.  · Tiene fiebre o escalofríos.  · No puede orinar.  Resumen  · La cistoscopia es un procedimiento que se utiliza para ayudar a diagnosticar y, a veces, tratar afecciones que afectan las vías  urinarias inferiores.  · La cistoscopia se hace con un instrumento fino en forma de tubo con una luz y una cámara en el extremo.  · Después del procedimiento, es común tener algo de sensibilidad o dolor en el abdomen y la uretra.  · Observe si hay sangre en la orina. Si la cantidad de sangre en la orina aumenta, comuníquese con el médico.  · Si le recetaron un antibiótico, tómelo como se lo haya indicado el médico. No deje de tomar el antibiótico, aunque comience a sentirse mejor.  Esta información no tiene como fin reemplazar el consejo del médico. Asegúrese de hacerle al médico cualquier pregunta que tenga.  Document Revised: 10/30/2018 Document Reviewed: 10/30/2018  Elsevier Patient Education © 2020 [employer]

## 2020-07-27 ENCOUNTER — Other Ambulatory Visit: Payer: Self-pay | Admitting: Family Medicine

## 2020-07-27 DIAGNOSIS — R310 Gross hematuria: Secondary | ICD-10-CM

## 2020-07-28 ENCOUNTER — Other Ambulatory Visit: Payer: Self-pay

## 2020-07-28 ENCOUNTER — Other Ambulatory Visit
Admission: RE | Admit: 2020-07-28 | Discharge: 2020-07-28 | Disposition: A | Discharge status: Home / Self Care | Payer: 59 | Source: Home / Self Care | Attending: Urology | Admitting: Urology

## 2020-07-28 ENCOUNTER — Ambulatory Visit
Admission: RE | Admit: 2020-07-28 | Discharge: 2020-07-28 | Disposition: A | Discharge status: Home / Self Care | Payer: 59 | Source: Ambulatory Visit | Attending: Urology | Admitting: Urology

## 2020-07-28 DIAGNOSIS — R310 Gross hematuria: Secondary | ICD-10-CM | POA: Insufficient documentation

## 2020-07-28 LAB — CREATININE, SERUM
Creatinine, Ser: 0.73 mg/dL (ref 0.61–1.24)
GFR calc non Af Amer: >60 mL/min (ref >60)

## 2020-07-28 MED ORDER — IOHEXOL 300 MG/ML  SOLN
150.0000 mL | Freq: Once | INTRAMUSCULAR | Status: AC | PRN
  Administered 2020-07-28: 11:00:00 125 mL via INTRAVENOUS

## 2020-08-10 ENCOUNTER — Other Ambulatory Visit: Payer: Self-pay | Admitting: Urology

## 2020-08-24 ENCOUNTER — Encounter: Payer: Self-pay | Admitting: Urology

## 2020-08-24 ENCOUNTER — Other Ambulatory Visit: Payer: Self-pay | Admitting: Urology

## 2020-08-26 ENCOUNTER — Telehealth: Payer: Self-pay | Admitting: Urology

## 2020-08-26 NOTE — Telephone Encounter (Signed)
This patient completed his CT urogram but no showed for his office cystoscopy.  Please call him to reschedule this.  Please help him understand the importance of the study and the value in direct visualization of the bladder to rule out pathology such as bladder cancer.  Hollice Espy, MD

## 2020-08-27 NOTE — Telephone Encounter (Signed)
Patient informed, scheduled cysto. Voiced understanding.

## 2020-08-31 ENCOUNTER — Other Ambulatory Visit: Payer: Self-pay | Admitting: Urology

## 2020-12-06 ENCOUNTER — Other Ambulatory Visit: Payer: Self-pay | Admitting: Physician Assistant

## 2020-12-06 DIAGNOSIS — M7542 Impingement syndrome of left shoulder: Secondary | ICD-10-CM

## 2020-12-13 ENCOUNTER — Ambulatory Visit
Admission: RE | Admit: 2020-12-13 | Discharge: 2020-12-13 | Disposition: A | Discharge status: Home / Self Care | Payer: 59 | Source: Ambulatory Visit | Attending: Physician Assistant | Admitting: Physician Assistant

## 2020-12-13 ENCOUNTER — Other Ambulatory Visit: Payer: Self-pay

## 2020-12-13 DIAGNOSIS — M7542 Impingement syndrome of left shoulder: Secondary | ICD-10-CM | POA: Diagnosis not present

## 2021-02-17 ENCOUNTER — Other Ambulatory Visit: Payer: Self-pay | Admitting: Surgery

## 2021-02-17 DIAGNOSIS — M4722 Other spondylosis with radiculopathy, cervical region: Secondary | ICD-10-CM

## 2021-03-01 ENCOUNTER — Ambulatory Visit: Payer: 59

## 2021-03-08 ENCOUNTER — Other Ambulatory Visit: Payer: Self-pay

## 2021-03-08 ENCOUNTER — Ambulatory Visit
Admission: RE | Admit: 2021-03-08 | Discharge: 2021-03-08 | Disposition: A | Discharge status: Home / Self Care | Payer: 59 | Source: Ambulatory Visit | Attending: Surgery | Admitting: Surgery

## 2021-03-08 DIAGNOSIS — M4722 Other spondylosis with radiculopathy, cervical region: Secondary | ICD-10-CM | POA: Diagnosis not present

## 2023-06-12 ENCOUNTER — Encounter: Payer: Self-pay | Admitting: Occupational Therapy

## 2023-06-12 ENCOUNTER — Ambulatory Visit: Payer: BC Managed Care – PPO | Attending: Student | Admitting: Occupational Therapy

## 2023-06-12 DIAGNOSIS — M79642 Pain in left hand: Secondary | ICD-10-CM | POA: Insufficient documentation

## 2023-06-12 DIAGNOSIS — M25642 Stiffness of left hand, not elsewhere classified: Secondary | ICD-10-CM | POA: Insufficient documentation

## 2023-06-12 DIAGNOSIS — M6281 Muscle weakness (generalized): Secondary | ICD-10-CM | POA: Insufficient documentation

## 2023-06-12 DIAGNOSIS — R600 Localized edema: Secondary | ICD-10-CM | POA: Insufficient documentation

## 2023-06-12 DIAGNOSIS — M25632 Stiffness of left wrist, not elsewhere classified: Secondary | ICD-10-CM | POA: Diagnosis present
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
## 2023-06-14 ENCOUNTER — Ambulatory Visit: Payer: BC Managed Care – PPO | Admitting: Occupational Therapy

## 2023-06-14 DIAGNOSIS — R600 Localized edema: Secondary | ICD-10-CM

## 2023-06-14 DIAGNOSIS — M25632 Stiffness of left wrist, not elsewhere classified: Secondary | ICD-10-CM

## 2023-06-14 DIAGNOSIS — M79642 Pain in left hand: Secondary | ICD-10-CM

## 2023-06-14 DIAGNOSIS — M25642 Stiffness of left hand, not elsewhere classified: Secondary | ICD-10-CM
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
## 2023-06-18 ENCOUNTER — Encounter: Payer: Self-pay | Admitting: Occupational Therapy

## 2023-06-21 ENCOUNTER — Encounter: Payer: Self-pay | Admitting: Occupational Therapy

## 2023-06-22 ENCOUNTER — Encounter: Payer: Self-pay | Admitting: Occupational Therapy

## 2023-06-22 ENCOUNTER — Ambulatory Visit: Payer: BC Managed Care – PPO | Admitting: Occupational Therapy

## 2023-06-22 DIAGNOSIS — R600 Localized edema: Secondary | ICD-10-CM

## 2023-06-22 DIAGNOSIS — M25642 Stiffness of left hand, not elsewhere classified: Secondary | ICD-10-CM

## 2023-06-22 DIAGNOSIS — M25632 Stiffness of left wrist, not elsewhere classified: Secondary | ICD-10-CM

## 2023-06-22 DIAGNOSIS — M6281 Muscle weakness (generalized): Secondary | ICD-10-CM

## 2023-06-22 DIAGNOSIS — M79642 Pain in left hand: Secondary | ICD-10-CM
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
## 2023-07-02 ENCOUNTER — Ambulatory Visit: Payer: BC Managed Care – PPO | Attending: Student | Admitting: Occupational Therapy

## 2023-07-02 DIAGNOSIS — M25642 Stiffness of left hand, not elsewhere classified: Secondary | ICD-10-CM | POA: Diagnosis present

## 2023-07-02 DIAGNOSIS — R600 Localized edema: Secondary | ICD-10-CM | POA: Insufficient documentation

## 2023-07-02 DIAGNOSIS — M25632 Stiffness of left wrist, not elsewhere classified: Secondary | ICD-10-CM | POA: Insufficient documentation

## 2023-07-02 DIAGNOSIS — M79642 Pain in left hand: Secondary | ICD-10-CM | POA: Diagnosis present

## 2023-07-02 DIAGNOSIS — M6281 Muscle weakness (generalized): Secondary | ICD-10-CM | POA: Diagnosis present
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
## 2023-07-05 ENCOUNTER — Ambulatory Visit: Payer: BC Managed Care – PPO | Admitting: Occupational Therapy

## 2023-07-09 ENCOUNTER — Ambulatory Visit: Payer: BC Managed Care – PPO | Admitting: Occupational Therapy

## 2023-07-09 DIAGNOSIS — R600 Localized edema: Secondary | ICD-10-CM

## 2023-07-09 DIAGNOSIS — M79642 Pain in left hand: Secondary | ICD-10-CM

## 2023-07-09 DIAGNOSIS — M6281 Muscle weakness (generalized): Secondary | ICD-10-CM

## 2023-07-09 DIAGNOSIS — M25642 Stiffness of left hand, not elsewhere classified: Secondary | ICD-10-CM

## 2023-07-09 DIAGNOSIS — M25632 Stiffness of left wrist, not elsewhere classified: Secondary | ICD-10-CM
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
## 2023-07-12 ENCOUNTER — Ambulatory Visit: Payer: BC Managed Care – PPO | Admitting: Occupational Therapy

## 2023-07-12 ENCOUNTER — Encounter: Payer: Self-pay | Admitting: *Deleted

## 2023-07-13 ENCOUNTER — Ambulatory Visit: Payer: BC Managed Care – PPO | Admitting: Certified Registered"

## 2023-07-13 ENCOUNTER — Other Ambulatory Visit: Payer: Self-pay

## 2023-07-13 ENCOUNTER — Ambulatory Visit
Admission: RE | Admit: 2023-07-13 | Discharge: 2023-07-13 | Disposition: A | Discharge status: Home / Self Care | Payer: BC Managed Care – PPO | Attending: Gastroenterology | Admitting: Gastroenterology

## 2023-07-13 ENCOUNTER — Encounter
Admission: RE | Disposition: A | Discharge status: Home / Self Care | Payer: Self-pay | Source: Home / Self Care | Attending: Gastroenterology

## 2023-07-13 ENCOUNTER — Encounter: Payer: Self-pay | Admitting: *Deleted

## 2023-07-13 DIAGNOSIS — N400 Enlarged prostate without lower urinary tract symptoms: Secondary | ICD-10-CM | POA: Diagnosis not present

## 2023-07-13 DIAGNOSIS — Z86718 Personal history of other venous thrombosis and embolism: Secondary | ICD-10-CM | POA: Diagnosis not present

## 2023-07-13 DIAGNOSIS — K219 Gastro-esophageal reflux disease without esophagitis: Secondary | ICD-10-CM | POA: Insufficient documentation

## 2023-07-13 DIAGNOSIS — Z87442 Personal history of urinary calculi: Secondary | ICD-10-CM | POA: Insufficient documentation

## 2023-07-13 DIAGNOSIS — I10 Essential (primary) hypertension: Secondary | ICD-10-CM | POA: Diagnosis not present

## 2023-07-13 DIAGNOSIS — Z1211 Encounter for screening for malignant neoplasm of colon: Secondary | ICD-10-CM | POA: Insufficient documentation

## 2023-07-13 DIAGNOSIS — K640 First degree hemorrhoids: Secondary | ICD-10-CM | POA: Insufficient documentation

## 2023-07-13 HISTORY — PX: COLONOSCOPY WITH PROPOFOL: SHX5780

## 2023-07-13 SURGERY — COLONOSCOPY WITH PROPOFOL
Anesthesia: General

## 2023-07-13 MED ORDER — SODIUM CHLORIDE 0.9 % IV SOLN: INTRAVENOUS | Status: DC

## 2023-07-13 MED ORDER — PROPOFOL 10 MG/ML IV BOLUS
INTRAVENOUS | Status: DC | PRN
Start: 2023-07-13 — End: 2023-07-13
  Administered 2023-07-13: 50 mg via INTRAVENOUS
  Administered 2023-07-13: 180 ug/kg/min via INTRAVENOUS

## 2023-07-13 NOTE — Anesthesia Postprocedure Evaluation (Signed)
Anesthesia Post Note  Patient: Jahmarion Bartusek  Procedure(s) Performed: COLONOSCOPY WITH PROPOFOL  Patient location during evaluation: Endoscopy Anesthesia Type: General Level of consciousness: awake and alert Pain management: pain level controlled Vital Signs Assessment: post-procedure vital signs reviewed and stable Respiratory status: spontaneous breathing, nonlabored ventilation, respiratory function stable and patient connected to nasal cannula oxygen Cardiovascular status: blood pressure returned to baseline and stable Postop Assessment: no apparent nausea or vomiting Anesthetic complications: no   There were no known notable events for this encounter.   Last Vitals:  Vitals:   07/13/23 1201 07/13/23 1208  BP: (!) 134/98 124/88  Pulse:    Resp:  20  Temp:    SpO2:      Last Pain:  Vitals:   07/13/23 1205  TempSrc:   PainSc: 0-No pain                 Louie Boston

## 2023-07-13 NOTE — Op Note (Signed)
[facility] Patient Name: Cristian Pena Procedure Date: 07/13/2023 11:22 AM MRN: [id number] Account #: [id number] Date of Birth: 01-Dec-1970 Admit Type: Outpatient Age: 52 Room: [facility] ENDO ROOM 3 Gender: Male Note Status: Finalized Instrument Name: Nelda Marseille [id number] Procedure:             Colonoscopy Indications:           Screening for colorectal malignant neoplasm Providers:             Eather Colas MD, MD Medicines:             Monitored Anesthesia Care Complications:         No immediate complications. Procedure:             Pre-Anesthesia Assessment:                        - Prior to the procedure, a History and Physical was                         performed, and patient medications and allergies were                         reviewed. The patient is competent. The risks and                         benefits of the procedure and the sedation options and                         risks were discussed with the patient. All questions                         were answered and informed consent was obtained.                         Patient identification and proposed procedure were                         verified by the physician, the nurse, the                         anesthesiologist, the anesthetist and the technician                         in the endoscopy suite. Mental Status Examination:                         alert and oriented. Airway Examination: normal                         oropharyngeal airway and neck mobility. Respiratory                         Examination: clear to auscultation. CV Examination:                         normal. Prophylactic Antibiotics: The patient does not                         require prophylactic antibiotics. Prior  Anticoagulants: The patient has taken no anticoagulant                         or antiplatelet agents. ASA Grade Assessment: II - A                         patient with mild  systemic disease. After reviewing                         the risks and benefits, the patient was deemed in                         satisfactory condition to undergo the procedure. The                         anesthesia plan was to use monitored anesthesia care                         (MAC). Immediately prior to administration of                         medications, the patient was re-assessed for adequacy                         to receive sedatives. The heart rate, respiratory                         rate, oxygen saturations, blood pressure, adequacy of                         pulmonary ventilation, and response to care were                         monitored throughout the procedure. The physical                         status of the patient was re-assessed after the                         procedure.                        After obtaining informed consent, the colonoscope was                         passed under direct vision. Throughout the procedure,                         the patient's blood pressure, pulse, and oxygen                         saturations were monitored continuously. The                         Colonoscope was introduced through the anus and                         advanced to the the cecum, identified by appendiceal  orifice and ileocecal valve. The colonoscopy was                         performed without difficulty. The patient tolerated                         the procedure well. The quality of the bowel                         preparation was adequate to identify polyps. The                         ileocecal valve, appendiceal orifice, and rectum were                         photographed. Findings:      The perianal and digital rectal examinations were normal.      Internal hemorrhoids were found during retroflexion. The hemorrhoids       were Grade I (internal hemorrhoids that do not prolapse).      The exam was otherwise without  abnormality on direct and retroflexion       views. Impression:            - Internal hemorrhoids.                        - The examination was otherwise normal on direct and                         retroflexion views.                        - No specimens collected. Recommendation:        - Discharge patient to home.                        - Resume previous diet.                        - Continue present medications.                        - Repeat colonoscopy in 10 years for screening                         purposes.                        - Return to referring physician as previously                         scheduled. Procedure Code(s):     --- Professional ---                        W0981, Colorectal cancer screening; colonoscopy on                         individual not meeting criteria for high risk Diagnosis Code(s):     --- Professional ---  Z12.11, Encounter for screening for malignant neoplasm                         of colon                        K64.0, First degree hemorrhoids CPT copyright 2022 American Medical Association. All rights reserved. The codes documented in this report are preliminary and upon coder review may  be revised to meet current compliance requirements. Eather Colas MD, MD 07/13/2023 11:50:52 AM Number of Addenda: 0 Note Initiated On: 07/13/2023 11:22 AM Scope Withdrawal Time: 0 hours 9 minutes 31 seconds  Total Procedure Duration: 0 hours 11 minutes 56 seconds  Estimated Blood Loss:  Estimated blood loss: none.      [facility]
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
## 2023-07-13 NOTE — Transfer of Care (Signed)
Immediate Anesthesia Transfer of Care Note  Patient: Cristian Pena  Procedure(s) Performed: COLONOSCOPY WITH PROPOFOL  Patient Location: PACU  Anesthesia Type:General  Level of Consciousness: drowsy  Airway & Oxygen Therapy: Patient Spontanous Breathing and Patient connected to nasal cannula oxygen  Post-op Assessment: Report given to RN and Post -op Vital signs reviewed and stable  Post vital signs: stable  Last Vitals:  Vitals Value Taken Time  BP    Temp    Pulse 93 07/13/23 1147  Resp 15 07/13/23 1147  SpO2 97 % 07/13/23 1147  Vitals shown include unfiled device data.  Last Pain:  Vitals:   07/13/23 1146  TempSrc:   PainSc: 0-No pain         Complications: No notable events documented.

## 2023-07-13 NOTE — Interval H&P Note (Signed)
History and Physical Interval Note:  07/13/2023 11:24 AM  Cristian Pena  has presented today for surgery, with the diagnosis of Z12.11 (ICD-10-CM) - Colon cancer screening.  The various methods of treatment have been discussed with the patient and family. After consideration of risks, benefits and other options for treatment, the patient has consented to  Procedure(s) with comments: COLONOSCOPY WITH PROPOFOL (N/A) - SPANISH INTERPRETER as a surgical intervention.  The patient's history has been reviewed, patient examined, no change in status, stable for surgery.  I have reviewed the patient's chart and labs.  Questions were answered to the patient's satisfaction.     Regis Bill  Ok to proceed with colonoscopy
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
## 2023-07-16 ENCOUNTER — Ambulatory Visit: Payer: BC Managed Care – PPO | Admitting: Occupational Therapy

## 2023-07-16 ENCOUNTER — Encounter: Payer: Self-pay | Admitting: Gastroenterology

## 2023-07-23 ENCOUNTER — Ambulatory Visit: Payer: BC Managed Care – PPO | Admitting: Occupational Therapy

## 2023-07-30 ENCOUNTER — Ambulatory Visit: Payer: BC Managed Care – PPO | Admitting: Occupational Therapy

## 2023-08-08 ENCOUNTER — Other Ambulatory Visit: Payer: Self-pay | Admitting: Student

## 2023-08-08 DIAGNOSIS — S62357D Nondisplaced fracture of shaft of fifth metacarpal bone, left hand, subsequent encounter for fracture with routine healing: Secondary | ICD-10-CM

## 2023-08-08 DIAGNOSIS — R2232 Localized swelling, mass and lump, left upper limb: Secondary | ICD-10-CM

## 2023-08-22 ENCOUNTER — Ambulatory Visit
Admission: RE | Admit: 2023-08-22 | Discharge: 2023-08-22 | Disposition: A | Discharge status: Home / Self Care | Payer: Worker's Compensation | Source: Ambulatory Visit | Attending: Student | Admitting: Student

## 2023-08-22 DIAGNOSIS — R2232 Localized swelling, mass and lump, left upper limb: Secondary | ICD-10-CM

## 2023-08-22 DIAGNOSIS — S62357D Nondisplaced fracture of shaft of fifth metacarpal bone, left hand, subsequent encounter for fracture with routine healing: Secondary | ICD-10-CM

## 2023-09-25 ENCOUNTER — Other Ambulatory Visit: Payer: Self-pay | Admitting: Student

## 2023-09-25 DIAGNOSIS — M7582 Other shoulder lesions, left shoulder: Secondary | ICD-10-CM

## 2023-09-25 DIAGNOSIS — M25812 Other specified joint disorders, left shoulder: Secondary | ICD-10-CM

## 2023-09-25 DIAGNOSIS — M7522 Bicipital tendinitis, left shoulder: Secondary | ICD-10-CM

## 2023-09-27 ENCOUNTER — Encounter: Payer: Self-pay | Admitting: Student

## 2023-09-28 ENCOUNTER — Other Ambulatory Visit: Payer: Self-pay | Admitting: Family Medicine

## 2023-09-28 DIAGNOSIS — N23 Unspecified renal colic: Secondary | ICD-10-CM

## 2023-09-28 DIAGNOSIS — R109 Unspecified abdominal pain: Secondary | ICD-10-CM

## 2023-10-02 ENCOUNTER — Ambulatory Visit: Payer: BC Managed Care – PPO

## 2023-10-03 ENCOUNTER — Ambulatory Visit
Admission: RE | Admit: 2023-10-03 | Discharge: 2023-10-03 | Disposition: A | Discharge status: Home / Self Care | Payer: BC Managed Care – PPO | Source: Ambulatory Visit | Attending: Student | Admitting: Student

## 2023-10-03 DIAGNOSIS — M25812 Other specified joint disorders, left shoulder: Secondary | ICD-10-CM

## 2023-10-03 DIAGNOSIS — M7582 Other shoulder lesions, left shoulder: Secondary | ICD-10-CM

## 2023-10-03 DIAGNOSIS — M7522 Bicipital tendinitis, left shoulder: Secondary | ICD-10-CM

## 2023-10-09 ENCOUNTER — Other Ambulatory Visit: Payer: Self-pay | Admitting: Family Medicine

## 2023-10-09 DIAGNOSIS — R109 Unspecified abdominal pain: Secondary | ICD-10-CM

## 2023-10-09 DIAGNOSIS — N23 Unspecified renal colic: Secondary | ICD-10-CM

## 2023-10-10 ENCOUNTER — Ambulatory Visit
Admission: RE | Admit: 2023-10-10 | Discharge: 2023-10-10 | Disposition: A | Discharge status: Home / Self Care | Payer: BC Managed Care – PPO | Source: Ambulatory Visit | Attending: Family Medicine | Admitting: Family Medicine

## 2023-10-10 DIAGNOSIS — N23 Unspecified renal colic: Secondary | ICD-10-CM | POA: Diagnosis present

## 2023-10-10 DIAGNOSIS — R109 Unspecified abdominal pain: Secondary | ICD-10-CM | POA: Diagnosis present

## 2023-11-12 ENCOUNTER — Other Ambulatory Visit: Payer: Self-pay

## 2023-11-12 DIAGNOSIS — N411 Chronic prostatitis: Secondary | ICD-10-CM

## 2023-11-13 ENCOUNTER — Other Ambulatory Visit: Payer: Self-pay | Admitting: Surgery

## 2023-11-13 ENCOUNTER — Other Ambulatory Visit
Admission: RE | Admit: 2023-11-13 | Discharge: 2023-11-13 | Disposition: A | Discharge status: Home / Self Care | Payer: BC Managed Care – PPO | Attending: Urology | Admitting: Urology

## 2023-11-13 ENCOUNTER — Ambulatory Visit: Payer: Self-pay | Admitting: Urology

## 2023-11-13 ENCOUNTER — Encounter: Payer: Self-pay | Admitting: Urology

## 2023-11-13 VITALS — BP 134/87 | HR 75 | Ht 66.0 in | Wt 186.0 lb

## 2023-11-13 DIAGNOSIS — Z87442 Personal history of urinary calculi: Secondary | ICD-10-CM

## 2023-11-13 DIAGNOSIS — N200 Calculus of kidney: Secondary | ICD-10-CM

## 2023-11-13 DIAGNOSIS — Z79899 Other long term (current) drug therapy: Secondary | ICD-10-CM | POA: Diagnosis not present

## 2023-11-13 DIAGNOSIS — Z125 Encounter for screening for malignant neoplasm of prostate: Secondary | ICD-10-CM

## 2023-11-13 DIAGNOSIS — I10 Essential (primary) hypertension: Secondary | ICD-10-CM | POA: Diagnosis not present

## 2023-11-13 DIAGNOSIS — N411 Chronic prostatitis: Secondary | ICD-10-CM | POA: Diagnosis present

## 2023-11-13 DIAGNOSIS — K2100 Gastro-esophageal reflux disease with esophagitis, without bleeding: Secondary | ICD-10-CM | POA: Insufficient documentation

## 2023-11-13 DIAGNOSIS — Z8042 Family history of malignant neoplasm of prostate: Secondary | ICD-10-CM

## 2023-11-13 DIAGNOSIS — Z87440 Personal history of urinary (tract) infections: Secondary | ICD-10-CM

## 2023-11-13 DIAGNOSIS — Z09 Encounter for follow-up examination after completed treatment for conditions other than malignant neoplasm: Secondary | ICD-10-CM

## 2023-11-13 LAB — URINALYSIS, COMPLETE (UACMP) WITH MICROSCOPIC
Bilirubin Urine: NEGATIVE
Glucose, UA: NEGATIVE mg/dL
Hgb urine dipstick: NEGATIVE
Ketones, ur: NEGATIVE mg/dL
Leukocytes,Ua: NEGATIVE
Nitrite: NEGATIVE
Protein, ur: NEGATIVE mg/dL
RBC / HPF: NONE SEEN RBC/hpf (ref 0–5)
Specific Gravity, Urine: 1.015 (ref 1.005–1.030)
Squamous Epithelial / HPF: NONE SEEN /[HPF] (ref 0–5)
WBC, UA: NONE SEEN WBC/hpf (ref 0–5)
pH: 5.5 (ref 5.0–8.0)

## 2023-11-13 MED ORDER — TAMSULOSIN HCL 0.4 MG PO CAPS: 0.4000 mg | ORAL_CAPSULE | Freq: Every day | ORAL | 11 refills | Status: AC
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
## 2023-11-14 ENCOUNTER — Encounter
Admission: RE | Admit: 2023-11-14 | Discharge: 2023-11-14 | Disposition: A | Discharge status: Home / Self Care | Payer: BC Managed Care – PPO | Source: Ambulatory Visit | Attending: Surgery | Admitting: Surgery

## 2023-11-14 ENCOUNTER — Other Ambulatory Visit: Payer: Self-pay

## 2023-11-14 VITALS — Ht 66.5 in | Wt 184.0 lb

## 2023-11-14 DIAGNOSIS — I10 Essential (primary) hypertension: Secondary | ICD-10-CM

## 2023-11-14 HISTORY — DX: Acute embolism and thrombosis of deep veins of right upper extremity: I82.621

## 2023-11-14 LAB — URINE CULTURE: Culture: NO GROWTH

## 2023-11-14 NOTE — Patient Instructions (Addendum)
Your procedure is scheduled on: Wednesday 11/21/23 Report to the Registration Desk on the 1st floor of the [facility] Mall. To find out your arrival time, please call [phone] between 1PM - 3PM on: Tuesday 11/20/23 If your arrival time is 6:00 am, do not arrive before that time as the Medical Mall entrance doors do not open until 6:00 am.  REMEMBER: Instructions that are not followed completely may result in serious medical risk, up to and including death; or upon the discretion of your surgeon and anesthesiologist your surgery may need to be rescheduled.  Do not eat food after midnight the night before surgery.  No gum chewing or hard candies.  You may however, drink CLEAR liquids up to 2 hours before you are scheduled to arrive for your surgery. Do not drink anything within 2 hours of your scheduled arrival time.  Clear liquids include: - water  - apple juice without pulp - black coffee or tea (Do NOT add milk or creamers to the coffee or tea) Do NOT drink anything that is not on this list.  In addition, your doctor has ordered for you to drink the provided:  Ensure Pre-Surgery Clear Carbohydrate Drink  Drinking this carbohydrate drink up to two hours before surgery helps to reduce insulin resistance and improve patient outcomes. Please complete drinking 2 hours before scheduled arrival time.  One week prior to surgery: Stop Anti-inflammatories (NSAIDS) such as Advil, Aleve, Ibuprofen, Motrin, Naproxen, Naprosyn and Aspirin based products such as Excedrin, Goody's Powder, BC Powder. Stop ANY OVER THE COUNTER supplements until after surgery.  You may however, continue to take Tylenol if needed for pain up until the day of surgery.   Continue taking all of your other prescription medications up until the day of surgery.  ON THE DAY OF SURGERY ONLY TAKE THESE MEDICATIONS WITH SIPS OF WATER:  pantoprazole (PROTONIX) 40 MG    No Alcohol for 24 hours before or after surgery.  No  Smoking including e-cigarettes for 24 hours before surgery.  No chewable tobacco products for at least 6 hours before surgery.  No nicotine patches on the day of surgery.  Do not use any "recreational" drugs for at least a week (preferably 2 weeks) before your surgery.  Please be advised that the combination of cocaine and anesthesia may have negative outcomes, up to and including death. If you test positive for cocaine, your surgery will be cancelled.  On the morning of surgery brush your teeth with toothpaste and water, you may rinse your mouth with mouthwash if you wish. Do not swallow any toothpaste or mouthwash.  Use CHG Soap or wipes as directed on instruction sheet.  Do not wear jewelry, make-up, hairpins, clips or nail polish.  For welded (permanent) jewelry: bracelets, anklets, waist bands, etc.  Please have this removed prior to surgery.  If it is not removed, there is a chance that hospital personnel will need to cut it off on the day of surgery.  Do not wear lotions, powders, or perfumes.   Do not shave body hair from the neck down 48 hours before surgery.  Contact lenses, hearing aids and dentures may not be worn into surgery.  Do not bring valuables to the hospital. [facility] is not responsible for any missing/lost belongings or valuables.   Notify your doctor if there is any change in your medical condition (cold, fever, infection).  Wear comfortable clothing (specific to your surgery type) to the hospital.  After surgery, you can help  prevent lung complications by doing breathing exercises.  Take deep breaths and cough every 1-2 hours. Your doctor may order a device called an Incentive Spirometer to help you take deep breaths. When coughing or sneezing, hold a pillow firmly against your incision with both hands. This is called "splinting." Doing this helps protect your incision. It also decreases belly discomfort.  If you are being admitted to the hospital  overnight, leave your suitcase in the car. After surgery it may be brought to your room.  In case of increased patient census, it may be necessary for you, the patient, to continue your postoperative care in the Same Day Surgery department.  If you are being discharged the day of surgery, you will not be allowed to drive home. You will need a responsible individual to drive you home and stay with you for 24 hours after surgery.   If you are taking public transportation, you will need to have a responsible individual with you.  Please call the Pre-admissions Testing Dept. at [phone] if you have any questions about these instructions.  Surgery Visitation Policy:  Patients having surgery or a procedure may have two visitors.  Children under the age of 26 must have an adult with them who is not the patient.  Temporary Visitor Restrictions Due to increasing cases of flu, RSV and COVID-19: Children ages 23 and under will not be able to visit patients in [facility] hospitals under most circumstances.  Inpatient Visitation:    Visiting hours are 7 a.m. to 8 p.m. Up to four visitors are allowed at one time in a patient room. The visitors may rotate out with other people during the day.  One visitor age 25 or older may stay with the patient overnight and must be in the room by 8 p.m.  Su procedimiento est programado para: Miercoles 11/21/23 Presntese en el mostrador de Tax adviser del [employer] Para saber su hora de llegada, llame al [phone] entre la 1:00 p. m. y las 3:00 p. m. en: Martes 11/20/23  Si su hora de llegada es a las 6:00 am, no llegue antes de esa hora ya que las puertas de [place] del [facility] no se abren Teacher, adult education las 6:00 am.  RECORDAR: Las instrucciones que no se siguen completamente pueden provocar riesgos mdicos graves, que pueden llegar hasta la [place]; o, segn el criterio de su cirujano y Scientific laboratory technician, es posible que sea Comptroller su Leisure centre manager.  No ingiera alimentos despus de la medianoche del da anterior a la ciruga. No mascar chicle ni caramelos duros.  Sin embargo, puede beber lquidos [employer] 2 horas antes de la fecha prevista de llegada a la [place]. No beba nada dentro de las 2 horas anteriores a su hora de Nurse, learning disability.  Los lquidos claros incluyen: - agua - jugo de manzana sin pulpa - caf o t negro (NO agregue leche ni cremas al caf o t) NO beba nada que no est en esta lista.  Adems, su mdico le ha recetado que beba lo siguiente: Asegrese de beber una bebida clara con carbohidratos antes de la Midwife esta bebida con carbohidratos [employer] horas antes de la ciruga ayuda a reducir la resistencia a la insulina y [employer] de [place]. Termine de beber 2 horas antes de la hora de llegada programada.  Una semana antes de la ciruga: Detenga los antiinflamatorios (AINE) como Advil, Aleve, Ibuprofeno, Motrin, Naproxen, Naprosyn y productos a  base de aspirina como Excedrin, Goody's Powder, [employer]. Suspenda CUALQUIER suplemento de venta libre hasta despus de la [place]. Sin embargo, puede Educational psychologist tomando Tylenol si es necesario para Marketing executive de la [place]. Contine tomando todos los medicamentos recetados, excepto los siguientes:   Siga las recomendaciones del cardilogo o PCP con respecto a suspender los anticoagulantes.  TOME SLO ESTOS MEDICAMENTOS LA MAANA DE LA CIRUGA CON UN SORBO DE AGUA:  pantoprazole (PROTONIX) 40 MG   Anticido (tome uno la noche anterior y otro la maana de la ciruga; [place] a prevenir las nuseas despus de la ciruga).   No consumir alcohol durante 24 horas antes o despus de la [place].  No fumar, incluidos los cigarrillos electrnicos, durante las 24 horas previas a la [place]. No consumir productos de tabaco masticables durante al menos 6 horas antes de la [place]. Sin parches de Optometrist  de la [place].  No use ningn medicamento "recreativo" durante al menos una semana (preferiblemente 2 semanas) antes de la ciruga. Tenga en cuenta que la combinacin de cocana y anestesia puede [employer], que pueden llegar hasta la [place]. Si su prueba de cocana da positivo, su ciruga ser cancelada.  La maana de la ciruga cepille sus dientes con pasta dental y agua, puede enjuagarse la boca con enjuague bucal si lo desea. No ingiera pasta de dientes ni enjuague bucal.  Utilice jabn o toallitas CHG como se indica en la hoja de instrucciones.  No use joyas, maquillaje, horquillas, clips ni esmalte de uas.  No use lociones, polvos ni perfumes.  No se afeite el vello corporal desde el cuello hacia abajo 48 horas antes de la [place].  No se pueden usar lentes de contacto, audfonos ni dentaduras postizas durante la [place].  No lleve objetos de valor al hospital. [facility] no es responsable de ninguna pertenencia u objeto de valor perdido o perdido.  Notifique a su mdico si hay algn cambio en su condicin mdica (resfriado, fiebre, infeccin).  Lleve ropa cmoda (especfica para su tipo de [place]) al hospital.  Despus de la ciruga, usted puede ayudar a prevenir complicaciones pulmonares haciendo ejercicios de respiracin. Respire profundamente y tosa cada 1 o 2 horas. Su mdico puede indicarle un dispositivo llamado espirmetro incentivador para ayudarle a respirar profundamente. Al toser o Engineering geologist, sostenga firmemente una almohada contra la incisin con ambas manos. Esto se llama "ferulizacin". Hacer esto ayuda a proteger su incisin. Tambin disminuye las molestias abdominales.  Si vas a pasar la noche en el hospital, deja tu maleta en el coche. Despus de la [place], es posible que lo lleven a su habitacin.  En caso de un mayor censo de [place], puede ser necesario que usted, el [place], contine con su atencin posoperatoria en el departamento de  [place] el [place].  Si le dan el alta el da de la [place], no se le permitir conducir a casa. Necesitar que una persona responsable lo lleve a su casa y se quede con usted durante las 24 horas posteriores a la [place].  Si viaja en transporte pblico, deber ir acompaado de una persona responsable.  Llame al Departamento de pruebas previas a la admisin al [phone] si tiene alguna pregunta sobre estas instrucciones.  Poltica de visitas a ciruga:  [employer] se someten a [place] o procedimiento pueden [employer] familiares o personas de apoyo con ellos, siempre y cuando la persona no sea positiva para COVID-19 ni experimente sus sntomas.  Visitas para pacientes hospitalizados:  El horario de visita es de 7 a 20 horas. Se permiten hasta cuatro visitantes a la vez en la habitacin de un paciente. Los visitantes podrn rotar con Garment/textile technologist. Neomia Dear persona de apoyo designada (adulto) podr pasar la noche.  Debido a un aumento en las tasas de VSR e influenza y las hospitalizaciones asociadas, los nios menores de 12 aos no podrn visitar a los pacientes en los hospitales de [employer]. Se siguen recomendando encarecidamente las mascarillas.     Preparing for Surgery with CHLORHEXIDINE GLUCONATE (CHG) Soap  Chlorhexidine Gluconate (CHG) Soap  o An antiseptic cleaner that kills germs and bonds with the skin to continue killing germs even after washing  o Used for showering the night before surgery and morning of surgery  Before surgery, you can play an important role by reducing the number of germs on your skin.  CHG (Chlorhexidine gluconate) soap is an antiseptic cleanser which kills germs and bonds with the skin to continue killing germs even after washing.  Please do not use if you have an allergy to CHG or antibacterial soaps. If your skin becomes reddened/irritated stop using the CHG.  1. Shower the NIGHT BEFORE SURGERY and the MORNING OF  SURGERY with CHG soap.  2. If you choose to wash your hair, wash your hair first as usual with your normal shampoo.  3. After shampooing, rinse your hair and body thoroughly to remove the shampoo.  4. Use CHG as you would any other liquid soap. You can apply CHG directly to the skin and wash gently with a scrungie or a clean washcloth.  5. Apply the CHG soap to your body only from the neck down. Do not use on open wounds or open sores. Avoid contact with your eyes, ears, mouth, and genitals (private parts). Wash face and genitals (private parts) with your normal soap.  6. Wash thoroughly, paying special attention to the area where your surgery will be performed.  7. Thoroughly rinse your body with warm water.  8. Do not shower/wash with your normal soap after using and rinsing off the CHG soap.  9. Pat yourself dry with a clean towel.  10. Wear clean pajamas to bed the night before surgery.  12. Place clean sheets on your bed the night of your first shower and do not sleep with pets.  13. Shower again with the CHG soap on the day of surgery prior to arriving at the hospital.  14. Do not apply any deodorants/lotions/powders.  15. Please wear clean clothes to the hospital.  Preparacin para la ciruga con jabn de GLUCONATO DE CLORHEXIDINA (CHG)  Jabn de gluconato de clorhexidina (CHG)  o Un limpiador antisptico que [employer] grmenes y se adhiere a la piel para seguir [employer] grmenes incluso despus del lavado.  o Se utiliza para ducharse la noche anterior a la [place] y la maana de la [place].  Antes de la [place], usted puede desempear un papel importante al reducir la cantidad de grmenes en su piel. El jabn CHG (gluconato de clorhexidina) es un limpiador antisptico que mata los grmenes y se adhiere a la piel para continuar matndolos incluso despus del lavado.  No lo utilice si es alrgico al CHG o a los jabones antibacterianos. Si su piel se enrojece o irrita,  deje de usar CHG.  1. Ducharse la NOCHE ANTES DE LA CIRUGA y la [place] DE LA CIRUGA con jabn CHG.  2. Si eliges  lavarte el cabello, lvalo primero como de costumbre con tu champ habitual.  3. Despus del champ, enjuague bien el cabello y el cuerpo para eliminar el champ.  4. Utilice CHG como lo hara con cualquier otro jabn lquido. Puede aplicar CHG directamente sobre la piel y lavar suavemente con un pauelo o una toallita limpia.  5. Aplique el jabn CHG en su cuerpo nicamente desde el cuello hacia abajo. No utilizar en heridas abiertas o llagas abiertas. Evite el contacto con los ojos, odos, boca y genitales (partes privadas). Lvese la cara y los genitales (partes privadas) con su jabn habitual.  6. Lvese bien, prestando especial atencin al rea donde se realizar su ciruga.  7. Enjuague bien su cuerpo con agua tibia.  8. No se duche ni se lave con su jabn normal despus de usar y enjuagar el jabn CHG.  9. Squese dando palmaditas con una toalla limpia.  10. Use pijamas limpios para dormir la noche anterior a la ciruga.  12. Coloque sbanas limpias en su cama la noche de su primera ducha y no duerma con mascotas.  13. Ducharse nuevamente con el jabn CHG el da de la ciruga antes de llegar al hospital.  14. No aplique desodorantes, lociones o polvos.  15. Por favor use ropa limpia al hospital.

## 2023-11-15 ENCOUNTER — Encounter
Admission: RE | Admit: 2023-11-15 | Discharge: 2023-11-15 | Disposition: A | Discharge status: Home / Self Care | Payer: BC Managed Care – PPO | Source: Ambulatory Visit | Attending: Surgery | Admitting: Surgery

## 2023-11-15 DIAGNOSIS — Z01818 Encounter for other preprocedural examination: Secondary | ICD-10-CM | POA: Insufficient documentation

## 2023-11-15 DIAGNOSIS — I10 Essential (primary) hypertension: Secondary | ICD-10-CM | POA: Insufficient documentation

## 2023-11-15 DIAGNOSIS — Z01810 Encounter for preprocedural cardiovascular examination: Secondary | ICD-10-CM | POA: Diagnosis not present

## 2023-11-15 LAB — CBC
HCT: 46.5 % (ref 39.0–52.0)
Hemoglobin: 16.3 g/dL (ref 13.0–17.0)
MCH: 29.4 pg (ref 26.0–34.0)
MCHC: 35.1 g/dL (ref 30.0–36.0)
MCV: 83.9 fL (ref 80.0–100.0)
Platelets: 196 10*3/uL (ref 150–400)
RBC: 5.54 MIL/uL (ref 4.22–5.81)
RDW: 13.2 % (ref 11.5–15.5)
WBC: 5.9 10*3/uL (ref 4.0–10.5)
nRBC: 0 % (ref 0.0–0.2)

## 2023-11-15 LAB — BASIC METABOLIC PANEL
Anion gap: 7 (ref 5–15)
BUN: 16 mg/dL (ref 6–20)
CO2: 24 mmol/L (ref 22–32)
Calcium: 8.7 mg/dL — ABNORMAL LOW (ref 8.9–10.3)
Chloride: 107 mmol/L (ref 98–111)
Creatinine, Ser: 0.68 mg/dL (ref 0.61–1.24)
GFR, Estimated: >60 mL/min (ref >60)
Glucose, Bld: 101 mg/dL — ABNORMAL HIGH (ref 70–99)
Potassium: 3.9 mmol/L (ref 3.5–5.1)
Sodium: 138 mmol/L (ref 135–145)

## 2023-11-20 MED ORDER — CEFAZOLIN SODIUM-DEXTROSE 2-4 GM/100ML-% IV SOLN
2.0000 g | INTRAVENOUS | Status: AC
  Administered 2023-11-21: 2 g via INTRAVENOUS

## 2023-11-20 MED ORDER — CHLORHEXIDINE GLUCONATE 0.12 % MT SOLN
15.0000 mL | Freq: Once | OROMUCOSAL | Status: AC
  Administered 2023-11-21: 15 mL via OROMUCOSAL

## 2023-11-20 MED ORDER — ORAL CARE MOUTH RINSE: 15.0000 mL | Freq: Once | OROMUCOSAL | Status: AC

## 2023-11-20 MED ORDER — LACTATED RINGERS IV SOLN
INTRAVENOUS | Status: DC
Start: 2023-11-20 — End: 2023-11-21

## 2023-11-21 ENCOUNTER — Ambulatory Visit
Admission: RE | Admit: 2023-11-21 | Discharge: 2023-11-21 | Disposition: A | Discharge status: Home / Self Care | Payer: BC Managed Care – PPO | Attending: Surgery | Admitting: Surgery

## 2023-11-21 ENCOUNTER — Other Ambulatory Visit: Payer: Self-pay

## 2023-11-21 ENCOUNTER — Ambulatory Visit: Payer: BC Managed Care – PPO | Admitting: Urgent Care

## 2023-11-21 ENCOUNTER — Ambulatory Visit: Payer: BC Managed Care – PPO | Admitting: Certified Registered"

## 2023-11-21 ENCOUNTER — Encounter
Admission: RE | Disposition: A | Discharge status: Home / Self Care | Payer: Self-pay | Source: Home / Self Care | Attending: Surgery

## 2023-11-21 ENCOUNTER — Encounter: Payer: Self-pay | Admitting: Surgery

## 2023-11-21 ENCOUNTER — Ambulatory Visit: Payer: BC Managed Care – PPO

## 2023-11-21 DIAGNOSIS — K219 Gastro-esophageal reflux disease without esophagitis: Secondary | ICD-10-CM | POA: Diagnosis not present

## 2023-11-21 DIAGNOSIS — I10 Essential (primary) hypertension: Secondary | ICD-10-CM | POA: Insufficient documentation

## 2023-11-21 DIAGNOSIS — S46012A Strain of muscle(s) and tendon(s) of the rotator cuff of left shoulder, initial encounter: Secondary | ICD-10-CM | POA: Diagnosis not present

## 2023-11-21 DIAGNOSIS — M25812 Other specified joint disorders, left shoulder: Secondary | ICD-10-CM | POA: Insufficient documentation

## 2023-11-21 DIAGNOSIS — G709 Myoneural disorder, unspecified: Secondary | ICD-10-CM | POA: Diagnosis not present

## 2023-11-21 DIAGNOSIS — M7522 Bicipital tendinitis, left shoulder: Secondary | ICD-10-CM | POA: Insufficient documentation

## 2023-11-21 DIAGNOSIS — S43432A Superior glenoid labrum lesion of left shoulder, initial encounter: Secondary | ICD-10-CM | POA: Diagnosis not present

## 2023-11-21 DIAGNOSIS — M19012 Primary osteoarthritis, left shoulder: Secondary | ICD-10-CM | POA: Insufficient documentation

## 2023-11-21 DIAGNOSIS — Z79899 Other long term (current) drug therapy: Secondary | ICD-10-CM | POA: Diagnosis not present

## 2023-11-21 DIAGNOSIS — Y990 Civilian activity done for income or pay: Secondary | ICD-10-CM | POA: Insufficient documentation

## 2023-11-21 HISTORY — PX: SHOULDER ARTHROSCOPY WITH SUBACROMIAL DECOMPRESSION, ROTATOR CUFF REPAIR AND BICEP TENDON REPAIR: SHX5687

## 2023-11-21 SURGERY — SHOULDER ARTHROSCOPY WITH SUBACROMIAL DECOMPRESSION, ROTATOR CUFF REPAIR AND BICEP TENDON REPAIR
Anesthesia: General | Site: Shoulder | Laterality: Left

## 2023-11-21 MED ORDER — FENTANYL CITRATE (PF) 100 MCG/2ML IJ SOLN: 25.0000 ug | INTRAMUSCULAR | Status: DC | PRN

## 2023-11-21 MED ORDER — LIDOCAINE HCL (CARDIAC) PF 100 MG/5ML IV SOSY
PREFILLED_SYRINGE | INTRAVENOUS | Status: DC | PRN
  Administered 2023-11-21 (×2): 100 mg via INTRAVENOUS

## 2023-11-21 MED ORDER — LACTATED RINGERS IR SOLN
Status: DC | PRN
  Administered 2023-11-21: 3000 mL

## 2023-11-21 MED ORDER — OXYCODONE HCL 5 MG/5ML PO SOLN: 5.0000 mg | Freq: Once | ORAL | Status: DC | PRN

## 2023-11-21 MED ORDER — MIDAZOLAM HCL 2 MG/2ML IJ SOLN
1.0000 mg | Freq: Once | INTRAMUSCULAR | Status: AC
  Administered 2023-11-21: 1 mg via INTRAVENOUS

## 2023-11-21 MED ORDER — MIDAZOLAM HCL 2 MG/2ML IJ SOLN
INTRAMUSCULAR | Status: AC
  Filled 2023-11-21: qty 2

## 2023-11-21 MED ORDER — METOPROLOL TARTRATE 5 MG/5ML IV SOLN
INTRAVENOUS | Status: DC | PRN
  Administered 2023-11-21: 1 mg via INTRAVENOUS
  Administered 2023-11-21: 2 mg via INTRAVENOUS

## 2023-11-21 MED ORDER — FENTANYL CITRATE (PF) 100 MCG/2ML IJ SOLN
INTRAMUSCULAR | Status: AC
  Filled 2023-11-21: qty 2

## 2023-11-21 MED ORDER — ROCURONIUM BROMIDE 100 MG/10ML IV SOLN
INTRAVENOUS | Status: DC | PRN
  Administered 2023-11-21: 40 mg via INTRAVENOUS
  Administered 2023-11-21: 60 mg via INTRAVENOUS

## 2023-11-21 MED ORDER — PROPOFOL 1000 MG/100ML IV EMUL
INTRAVENOUS | Status: AC
  Filled 2023-11-21: qty 100

## 2023-11-21 MED ORDER — ACETAMINOPHEN 10 MG/ML IV SOLN
INTRAVENOUS | Status: DC | PRN
  Administered 2023-11-21: 1000 mg via INTRAVENOUS

## 2023-11-21 MED ORDER — CEFAZOLIN SODIUM-DEXTROSE 2-4 GM/100ML-% IV SOLN
INTRAVENOUS | Status: AC
  Filled 2023-11-21: qty 100

## 2023-11-21 MED ORDER — 0.9 % SODIUM CHLORIDE (POUR BTL) OPTIME
TOPICAL | Status: DC | PRN
  Administered 2023-11-21: 500 mL

## 2023-11-21 MED ORDER — KETOROLAC TROMETHAMINE 30 MG/ML IJ SOLN
30.0000 mg | Freq: Once | INTRAMUSCULAR | Status: AC
  Administered 2023-11-21: 30 mg via INTRAVENOUS

## 2023-11-21 MED ORDER — LACTATED RINGERS IV SOLN
INTRAVENOUS | Status: DC | PRN
  Administered 2023-11-21: 3001 mL

## 2023-11-21 MED ORDER — PHENYLEPHRINE 80 MCG/ML (10ML) SYRINGE FOR IV PUSH (FOR BLOOD PRESSURE SUPPORT)
PREFILLED_SYRINGE | INTRAVENOUS | Status: DC | PRN
  Administered 2023-11-21: 160 ug via INTRAVENOUS

## 2023-11-21 MED ORDER — OXYCODONE HCL 5 MG PO TABS: 5.0000 mg | ORAL_TABLET | ORAL | 0 refills | Status: DC | PRN

## 2023-11-21 MED ORDER — ACETAMINOPHEN 10 MG/ML IV SOLN: 1000.0000 mg | Freq: Once | INTRAVENOUS | Status: DC | PRN

## 2023-11-21 MED ORDER — EPINEPHRINE PF 1 MG/ML IJ SOLN
INTRAMUSCULAR | Status: AC
  Filled 2023-11-21: qty 1

## 2023-11-21 MED ORDER — OXYCODONE HCL 5 MG PO TABS: 5.0000 mg | ORAL_TABLET | Freq: Once | ORAL | Status: DC | PRN

## 2023-11-21 MED ORDER — DROPERIDOL 2.5 MG/ML IJ SOLN
0.6250 mg | Freq: Once | INTRAMUSCULAR | Status: AC | PRN
  Administered 2023-11-21: 0.625 mg via INTRAVENOUS

## 2023-11-21 MED ORDER — EPHEDRINE SULFATE-NACL 50-0.9 MG/10ML-% IV SOSY
PREFILLED_SYRINGE | INTRAVENOUS | Status: DC | PRN
  Administered 2023-11-21: 10 mg via INTRAVENOUS

## 2023-11-21 MED ORDER — ACETAMINOPHEN 10 MG/ML IV SOLN
INTRAVENOUS | Status: AC
  Filled 2023-11-21: qty 100

## 2023-11-21 MED ORDER — LIDOCAINE HCL (PF) 1 % IJ SOLN
INTRAMUSCULAR | Status: AC
  Filled 2023-11-21: qty 5

## 2023-11-21 MED ORDER — PROPOFOL 10 MG/ML IV BOLUS
INTRAVENOUS | Status: DC | PRN
  Administered 2023-11-21: 20 mg via INTRAVENOUS
  Administered 2023-11-21: 160 mg via INTRAVENOUS

## 2023-11-21 MED ORDER — DROPERIDOL 2.5 MG/ML IJ SOLN
INTRAMUSCULAR | Status: AC
  Filled 2023-11-21: qty 2

## 2023-11-21 MED ORDER — ONDANSETRON HCL 4 MG/2ML IJ SOLN
INTRAMUSCULAR | Status: DC | PRN
  Administered 2023-11-21: 4 mg via INTRAVENOUS

## 2023-11-21 MED ORDER — DEXAMETHASONE SODIUM PHOSPHATE 10 MG/ML IJ SOLN
INTRAMUSCULAR | Status: DC | PRN
  Administered 2023-11-21: 10 mg via INTRAVENOUS

## 2023-11-21 MED ORDER — FENTANYL CITRATE (PF) 100 MCG/2ML IJ SOLN
INTRAMUSCULAR | Status: DC | PRN
  Administered 2023-11-21 (×4): 50 ug via INTRAVENOUS

## 2023-11-21 MED ORDER — BUPIVACAINE HCL (PF) 0.5 % IJ SOLN
INTRAMUSCULAR | Status: DC | PRN
  Administered 2023-11-21: 10 mL via PERINEURAL

## 2023-11-21 MED ORDER — BUPIVACAINE-EPINEPHRINE (PF) 0.5% -1:200000 IJ SOLN
INTRAMUSCULAR | Status: AC
  Filled 2023-11-21: qty 30

## 2023-11-21 MED ORDER — BUPIVACAINE HCL (PF) 0.5 % IJ SOLN
INTRAMUSCULAR | Status: AC
  Filled 2023-11-21: qty 10

## 2023-11-21 MED ORDER — BUPIVACAINE LIPOSOME 1.3 % IJ SUSP
INTRAMUSCULAR | Status: DC | PRN
  Administered 2023-11-21: 20 mL via PERINEURAL

## 2023-11-21 MED ORDER — BUPIVACAINE LIPOSOME 1.3 % IJ SUSP
INTRAMUSCULAR | Status: AC
  Filled 2023-11-21: qty 20

## 2023-11-21 MED ORDER — LIDOCAINE HCL (PF) 1 % IJ SOLN
INTRAMUSCULAR | Status: DC | PRN
  Administered 2023-11-21: 2 mL via SUBCUTANEOUS

## 2023-11-21 MED ORDER — CHLORHEXIDINE GLUCONATE 0.12 % MT SOLN
OROMUCOSAL | Status: AC
  Filled 2023-11-21: qty 15

## 2023-11-21 MED ORDER — BUPIVACAINE-EPINEPHRINE 0.5% -1:200000 IJ SOLN
INTRAMUSCULAR | Status: DC | PRN
  Administered 2023-11-21: 30 mL

## 2023-11-21 MED ORDER — FENTANYL CITRATE PF 50 MCG/ML IJ SOSY
PREFILLED_SYRINGE | INTRAMUSCULAR | Status: AC
  Filled 2023-11-21: qty 1

## 2023-11-21 MED ORDER — KETOROLAC TROMETHAMINE 30 MG/ML IJ SOLN
INTRAMUSCULAR | Status: AC
  Filled 2023-11-21: qty 1

## 2023-11-21 SURGICAL SUPPLY — 47 items
ANCHOR BONE REGENETEN (Anchor) IMPLANT
ANCHOR JUGGERKNOT WTAP NDL 2.9 (Anchor) IMPLANT
ANCHOR SUT W/ ORTHOCORD (Anchor) IMPLANT
ANCHOR TENDON REGENETEN (Staple) IMPLANT
BIT DRILL JUGRKNT W/NDL BIT2.9 (DRILL) IMPLANT
BLADE FULL RADIUS 3.5 (BLADE) ×1 IMPLANT
BUR ACROMIONIZER 4.0 (BURR) ×1 IMPLANT
CHLORAPREP W/TINT 26 (MISCELLANEOUS) ×1 IMPLANT
COVER MAYO STAND STRL (DRAPES) ×1 IMPLANT
DILATOR 5.5 THREADED HEALICOIL (MISCELLANEOUS) IMPLANT
DRILL JUGGERKNOT W/NDL BIT 2.9 (DRILL) ×1
ELECT CAUTERY BLADE 6.4 (BLADE) ×1 IMPLANT
ELECT REM PT RETURN 9FT ADLT (ELECTROSURGICAL) ×1
ELECTRODE REM PT RTRN 9FT ADLT (ELECTROSURGICAL) ×1 IMPLANT
GAUZE SPONGE 4X4 12PLY STRL (GAUZE/BANDAGES/DRESSINGS) ×1 IMPLANT
GAUZE XEROFORM 1X8 LF (GAUZE/BANDAGES/DRESSINGS) ×1 IMPLANT
GLOVE BIO SURGEON STRL SZ7.5 (GLOVE) ×2 IMPLANT
GLOVE BIO SURGEON STRL SZ8 (GLOVE) ×2 IMPLANT
GLOVE BIOGEL PI IND STRL 8 (GLOVE) ×1 IMPLANT
GLOVE INDICATOR 8.0 STRL GRN (GLOVE) ×1 IMPLANT
GOWN STRL REUS W/ TWL LRG LVL3 (GOWN DISPOSABLE) ×1 IMPLANT
GOWN STRL REUS W/ TWL XL LVL3 (GOWN DISPOSABLE) ×1 IMPLANT
GRASPER SUT 15 45D LOW PRO (SUTURE) IMPLANT
IMPL REGENETEN MEDIUM (Shoulder) IMPLANT
IMPLANT REGENETEN MEDIUM (Shoulder) ×1 IMPLANT
IV LR IRRIG 3000ML ARTHROMATIC (IV SOLUTION) ×2 IMPLANT
KIT CANNULA 8X76-LX IN CANNULA (CANNULA) ×1 IMPLANT
MANIFOLD NEPTUNE II (INSTRUMENTS) ×1 IMPLANT
MASK FACE SPIDER DISP (MASK) ×1 IMPLANT
MAT ABSORB FLUID 56X50 GRAY (MISCELLANEOUS) ×1 IMPLANT
PACK ARTHROSCOPY SHOULDER (MISCELLANEOUS) ×1 IMPLANT
PAD ABD DERMACEA PRESS 5X9 (GAUZE/BANDAGES/DRESSINGS) ×2 IMPLANT
PASSER SUT FIRSTPASS SELF (INSTRUMENTS) IMPLANT
SLING ARM LRG DEEP (SOFTGOODS) ×1 IMPLANT
SLING ULTRA II LG (MISCELLANEOUS) ×1 IMPLANT
SPONGE T-LAP 18X18 ~~LOC~~+RFID (SPONGE) ×1 IMPLANT
STAPLER SKIN PROX 35W (STAPLE) ×1 IMPLANT
STRAP SAFETY 5IN WIDE (MISCELLANEOUS) ×1 IMPLANT
SUT ETHIBOND 0 MO6 C/R (SUTURE) ×1 IMPLANT
SUT ULTRABRAID 2 COBRAID 38 (SUTURE) IMPLANT
SUT VIC AB 2-0 CT1 TAPERPNT 27 (SUTURE) ×2 IMPLANT
TAPE MICROFOAM 4IN (TAPE) ×1 IMPLANT
TRAP FLUID SMOKE EVACUATOR (MISCELLANEOUS) ×1 IMPLANT
TUBE SET DOUBLEFLO INFLOW (TUBING) ×1 IMPLANT
TUBING CONNECTING 10 (TUBING) ×1 IMPLANT
WAND WEREWOLF FLOW 90D (MISCELLANEOUS) ×1 IMPLANT
WATER STERILE IRR 500ML POUR (IV SOLUTION) ×1 IMPLANT

## 2023-11-21 NOTE — Anesthesia Procedure Notes (Signed)
Procedure Name: Intubation Date/Time: 11/21/2023 7:53 AM  Performed by: Maryla Morrow., CRNAPre-anesthesia Checklist: Patient identified, Patient being monitored, Timeout performed, Emergency Drugs available and Suction available Patient Re-evaluated:Patient Re-evaluated prior to induction Oxygen Delivery Method: Circle system utilized Preoxygenation: Pre-oxygenation with 100% oxygen Induction Type: IV induction Ventilation: Mask ventilation without difficulty Laryngoscope Size: McGrath and 4 Grade View: Grade I Tube type: Oral Tube size: 7.5 mm Number of attempts: 1 Airway Equipment and Method: Stylet Placement Confirmation: ETT inserted through vocal cords under direct vision, positive ETCO2 and breath sounds checked- equal and bilateral Secured at: 22 cm Tube secured with: Tape Dental Injury: Teeth and Oropharynx as per pre-operative assessment
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
## 2023-11-21 NOTE — Discharge Instructions (Addendum)
Orthopedic discharge instructions: Keep dressing dry and intact.  May shower after dressing changed on post-op day #4 (Sunday).  Cover staples with Band-Aids after drying off. Apply ice frequently to shoulder. Take ibuprofen 600-800 mg TID with meals for 3-5 days, then as necessary. Take oxycodone as prescribed when needed.  May supplement with ES Tylenol if necessary. Keep shoulder immobilizer on at all times except may remove for bathing purposes. Follow-up in 10-14 days or as scheduled.

## 2023-11-21 NOTE — Anesthesia Postprocedure Evaluation (Signed)
Anesthesia Post Note  Patient: Cristian Pena  Procedure(s) Performed: LEFT SHOULDER ARTHROSCOPY WITH DEBRIDEMENT, DECOMPRESSION, DISTAL CLAVICLE EXCISION, BICEPS TENODESIS AND POSSIBLE ROTATOR CUFF REPAIR. (Left: Shoulder)  Patient location during evaluation: PACU Anesthesia Type: General Level of consciousness: awake and alert Pain management: pain level controlled Vital Signs Assessment: post-procedure vital signs reviewed and stable Respiratory status: spontaneous breathing, nonlabored ventilation, respiratory function stable and patient connected to nasal cannula oxygen Cardiovascular status: blood pressure returned to baseline and stable Postop Assessment: no apparent nausea or vomiting Anesthetic complications: no   There were no known notable events for this encounter.   Last Vitals:  Vitals:   11/21/23 1030 11/21/23 1039  BP:  119/82  Pulse: 79 85  Resp: 16 20  Temp: (!) 36.2 C (!) 36.1 C  SpO2: 97% 96%    Last Pain:  Vitals:   11/21/23 1039  TempSrc: Tympanic  PainSc: 0-No pain                 Yevette Edwards

## 2023-11-21 NOTE — Anesthesia Procedure Notes (Signed)
Anesthesia Regional Block: Interscalene brachial plexus block   Pre-Anesthetic Checklist: , timeout performed,  Correct Patient, Correct Site, Correct Laterality,  Correct Procedure, Correct Position, site marked,  Risks and benefits discussed,  Surgical consent,  Pre-op evaluation,  At surgeon's request and post-op pain management  Laterality: Upper and Left  Prep: chloraprep       Needles:  Injection technique: Single-shot  Needle Type: Echogenic Stimulator Needle     Needle Length: 10cm  Needle Gauge: 20     Additional Needles:   Procedures:,,,, ultrasound used (permanent image in chart),,    Narrative:  End time: 11/21/2023 7:20 AM  Performed by: Personally  Anesthesiologist: Yevette Edwards, MD  Additional Notes: Pt. Identified and accepting of procedure after risks and benefits fully reviewed and questions answered. Time out performed and laterality confirmed prior to procedure.  ISNB  performed without difficulty and well tolerated.  Neg IV and SATD.  No pain on injection of Local anesthetic and VSST.
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
## 2023-11-21 NOTE — Op Note (Signed)
11/21/2023  10:04 AM  Patient:   Cristian Pena  Pre-Op Diagnosis:   Impingement/tendinopathy with possible rotator cuff tear, biceps tendinopathy, and degenerative joint disease of AC joint, left shoulder.  Post-Op Diagnosis:   Impingement/tendinopathy with partial-thickness rotator cuff tear, SLAP tear, degenerative joint disease of AC joint, and biceps tendinopathy, left shoulder.  Procedure:   Limited arthroscopic debridement, arthroscopic SLAP repair, arthroscopic subacromial decompression, arthroscopic distal clavicle excision, mini-open rotator cuff repair with regenerative patch, and mini-open biceps tenodesis, left shoulder.  Anesthesia:   General endotracheal with interscalene block using Exparel placed preoperatively by the anesthesiologist.  Surgeon:   Maryagnes Amos, MD  Assistant:   Griffin Basil, RNFA  Findings:   As above. There was moderate fraying of the anterior and superior portions of the labrum with detachment of the superior labrum from the 10:30 to the 1:30 position. There was a small articular sided partial-thickness tear involving approximate 50% of the insertional fibers of the mid supraspinatus tendon. The remainder the rotator cuff was in satisfactory condition. There was moderate "lip sticking" of the biceps tendon without partial or full-thickness tears. There were grade 1-2 chondromalacial changes involving the central glenoid, but the articular surface of the humeral head was in excellent condition.  Complications:   None  Fluids:   900 cc  Estimated blood loss:   30 cc  Tourniquet time:   None  Drains:   None  Closure:   Staples      Brief clinical note:   The patient is a 53 year old male with a history of progressively worsening left shoulder pain. The patient's symptoms have progressed despite medications, activity modification, etc. The patient's history and examination are consistent with impingement/tendinopathy with a possible rotator cuff tear, as  well as for degenerative joint disease of the [facility] joint. These findings were confirmed by MRI scan. The patient presents at this time for definitive management of these shoulder symptoms.  Procedure:   The patient underwent placement of an interscalene block by the anesthesiologist in the preoperative holding area before being brought into the operating room and lain in the supine position. The patient then underwent general endotracheal intubation and anesthesia before being repositioned in the beach chair position using the beach chair positioner. The left shoulder and upper extremity were prepped with ChloraPrep solution before being draped sterilely. Preoperative antibiotics were administered. A timeout was performed to confirm the proper surgical site before the expected portal sites and incision site were injected with 0.5% Sensorcaine with epinephrine.   A posterior portal was created and the glenohumeral joint thoroughly inspected with the findings as described above. An anterior portal was created using an outside-in technique. The labrum and rotator cuff were further probed, again confirming the above-noted findings. The areas of labral fraying were debrided back to stable margins using the full-radius resector. The full-radius sector also was used to debride the articular sided partial-thickness tear involving the mid insertional fibers of the supraspinatus tendon and areas of synovitis. The ArthroCare wand was inserted and used to release the biceps tendon from its labral anchor.  Is also was used to obtain hemostasis as well as to "anneal" the labrum superiorly and anteriorly.   Probing of the superior labrum demonstrated moderate instability with a "droopy eyelid" appearance. Therefore, is elected to repair/stabilize the Type II SLAP tear. A separate superolateral portal was created using an outside in technique. Utilizing a Mitek bio knotless anchor placed at approximately the 12 o'clock  position, the SLAP tear was  repaired. The adequacy of repair was verified by probing. The instruments were removed from the joint after suctioning the excess fluid.  The camera was repositioned through the posterior portal into the subacromial space. A separate lateral portal was created using an outside-in technique. The 3.5 mm full-radius resector was introduced and used to perform a subtotal bursectomy. The ArthroCare wand was then inserted and used to remove the periosteal tissue off the undersurface of the anterior third of the acromion as well as to recess the coracoacromial ligament from its attachment along the anterior and lateral margins of the acromion. The 4.0 mm acromionizing bur was introduced and used to complete the decompression by removing the undersurface of the anterior third of the acromion. The full radius resector was reintroduced to remove any residual bony debris before the ArthroCare wand was reintroduced to obtain hemostasis.   The distal clavicle was identified and the lateral 5-6 mm denuded using the ArthroCare wand. The 4.0 mm acromionizing bur was reintroduced through the lateral portal and used to remove the undersurface of the distal clavicle. The camera was repositioned through the lateral portal and the 4.0 mm acromionizing bur introduced through the anterior portal. The bur was then used to remove the distal 8 to 10 mm of distal clavicle. The adequacy of resection was verified arthroscopically both from the lateral and from the anterior portal site, and found to be excellent. The instruments were then removed from the subacromial space after suctioning the excess fluid.  An approximately 4-5 cm incision was made over the anterolateral aspect of the shoulder beginning at the anterolateral corner of the acromion and extending distally in line with the bicipital groove. This incision was carried down through the subcutaneous tissues to expose the deltoid fascia. The raphae  between the anterior and middle thirds was identified and this plane developed to provide access into the subacromial space. Additional bursal tissues were debrided sharply using Metzenbaum scissors. The rotator cuff tear was readily identified by palpation.   The bicipital groove was identified by palpation and opened for 1-1.5 cm. The biceps tendon stump was retrieved through this defect. The floor of the bicipital groove was roughened with a curet before a Biomet 2.9 mm JuggerKnot anchor was inserted. Both sets of sutures were passed through the biceps tendon and tied securely to effect the tenodesis. The bicipital sheath was reapproximated using two #0 Ethibond interrupted sutures, incorporating the biceps tendon to further reinforce the tenodesis.  The rotator cuff was assessed. Given the fact that less than 50% of the footprint was involved, it was felt best to apply a [employer] Regeneten patch over the site to stimulate healing rather than to take down the rotator cuff (completing the tear). Therefore, a medium sized patch was selected and applied over the site of the partial-thickness tear of the supraspinatus tendon and secured using the appropriate bone and soft tissue staples.  The wound was copiously irrigated with sterile saline solution before the deltoid raphae was reapproximated using 2-0 Vicryl interrupted sutures. The subcutaneous tissues were closed in two layers using 2-0 Vicryl interrupted sutures before the skin was closed using staples. The portal sites also were closed using staples. A sterile bulky dressing was applied to the shoulder before the arm was placed into a shoulder immobilizer. The patient was then awakened, extubated, and returned to the recovery room in satisfactory condition after tolerating the procedure well.

## 2023-11-21 NOTE — Transfer of Care (Signed)
Immediate Anesthesia Transfer of Care Note  Patient: Cristian Pena  Procedure(s) Performed: LEFT SHOULDER ARTHROSCOPY WITH DEBRIDEMENT, DECOMPRESSION, DISTAL CLAVICLE EXCISION, BICEPS TENODESIS AND POSSIBLE ROTATOR CUFF REPAIR. (Left: Shoulder)  Patient Location: PACU  Anesthesia Type:General  Level of Consciousness: drowsy and patient cooperative  Airway & Oxygen Therapy: Patient Spontanous Breathing and Patient connected to face mask oxygen  Post-op Assessment: Report given to RN and Post -op Vital signs reviewed and stable  Post vital signs: stable  Last Vitals:  Vitals Value Taken Time  BP 138/89 11/21/23 0945  Temp 36.2 C 11/21/23 0944  Pulse 89 11/21/23 0947  Resp 14 11/21/23 0947  SpO2 100 % 11/21/23 0947  Vitals shown include unfiled device data.  Last Pain:  Vitals:   11/21/23 0945  TempSrc:   PainSc: Asleep         Complications: No notable events documented.

## 2023-11-22 ENCOUNTER — Encounter: Payer: Self-pay | Admitting: Surgery

## 2023-11-28 ENCOUNTER — Ambulatory Visit: Payer: 59 | Admitting: Urology

## 2023-12-11 ENCOUNTER — Ambulatory Visit: Payer: 59 | Admitting: Urology

## 2024-05-14 ENCOUNTER — Other Ambulatory Visit: Payer: Self-pay | Admitting: Family Medicine

## 2024-05-14 DIAGNOSIS — L729 Follicular cyst of the skin and subcutaneous tissue, unspecified: Secondary | ICD-10-CM

## 2024-05-26 ENCOUNTER — Ambulatory Visit
Admission: RE | Admit: 2024-05-26 | Discharge: 2024-05-26 | Disposition: A | Discharge status: Home / Self Care | Source: Ambulatory Visit | Attending: Family Medicine | Admitting: Family Medicine

## 2024-05-26 DIAGNOSIS — L729 Follicular cyst of the skin and subcutaneous tissue, unspecified: Secondary | ICD-10-CM | POA: Insufficient documentation

## 2024-06-13 ENCOUNTER — Other Ambulatory Visit: Payer: Self-pay | Admitting: Physical Medicine & Rehabilitation

## 2024-06-13 DIAGNOSIS — M542 Cervicalgia: Secondary | ICD-10-CM

## 2024-06-21 ENCOUNTER — Inpatient Hospital Stay: Admission: RE | Admit: 2024-06-21 | Source: Ambulatory Visit

## 2024-06-27 ENCOUNTER — Ambulatory Visit
Admission: RE | Admit: 2024-06-27 | Discharge: 2024-06-27 | Disposition: A | Discharge status: Home / Self Care | Source: Ambulatory Visit | Attending: Physical Medicine & Rehabilitation | Admitting: Physical Medicine & Rehabilitation

## 2024-06-27 DIAGNOSIS — M542 Cervicalgia: Secondary | ICD-10-CM

## 2024-07-17 ENCOUNTER — Other Ambulatory Visit: Payer: Self-pay | Admitting: Physical Medicine & Rehabilitation

## 2024-07-17 DIAGNOSIS — M5412 Radiculopathy, cervical region: Secondary | ICD-10-CM

## 2024-08-06 NOTE — Discharge Instructions (Signed)

## 2024-08-07 ENCOUNTER — Ambulatory Visit
Admission: RE | Admit: 2024-08-07 | Discharge: 2024-08-07 | Disposition: A | Discharge status: Home / Self Care | Source: Ambulatory Visit | Attending: Physical Medicine & Rehabilitation | Admitting: Physical Medicine & Rehabilitation

## 2024-08-07 DIAGNOSIS — M5412 Radiculopathy, cervical region: Secondary | ICD-10-CM

## 2024-08-07 MED ORDER — TRIAMCINOLONE ACETONIDE 40 MG/ML IJ SUSP (RADIOLOGY)
60.0000 mg | Freq: Once | INTRAMUSCULAR | Status: AC
  Administered 2024-08-07: 60 mg via EPIDURAL

## 2024-08-07 MED ORDER — IOPAMIDOL (ISOVUE-300) INJECTION 61%
3.0000 mL | Freq: Once | INTRAVENOUS | Status: AC | PRN
  Administered 2024-08-07: 3 mL

## 2024-10-14 ENCOUNTER — Ambulatory Visit: Admitting: Neurosurgery
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
## 2024-10-27 ENCOUNTER — Inpatient Hospital Stay
Admission: RE | Admit: 2024-10-27 | Discharge: 2024-10-27 | Disposition: A | Discharge status: Home / Self Care | Payer: Self-pay | Source: Ambulatory Visit | Attending: Neurosurgery | Admitting: Neurosurgery

## 2024-10-27 ENCOUNTER — Other Ambulatory Visit: Payer: Self-pay | Admitting: Family Medicine

## 2024-10-27 DIAGNOSIS — Z049 Encounter for examination and observation for unspecified reason: Secondary | ICD-10-CM

## 2024-10-28 ENCOUNTER — Inpatient Hospital Stay
Admission: RE | Admit: 2024-10-28 | Discharge: 2024-10-28 | Disposition: A | Payer: Self-pay | Source: Ambulatory Visit | Attending: Neurosurgery | Admitting: Neurosurgery

## 2024-10-28 ENCOUNTER — Other Ambulatory Visit: Payer: Self-pay | Admitting: Family Medicine

## 2024-10-28 DIAGNOSIS — Z049 Encounter for examination and observation for unspecified reason: Secondary | ICD-10-CM

## 2024-11-03 ENCOUNTER — Ambulatory Visit: Admitting: Neurosurgery
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
## 2024-11-11 NOTE — Care Plan (Signed)
 Transition of Care Encounter Data   Call attempt: 2 Admission date: 11/05/24 Discharge date: 11/07/24 Discharge diagnosis: Type I MI Patient post discharge: Medications:         [facility]: [phone] Hover: [phone]:    Other: Contact PCP:       [facility] TRANSITIONAL CASE MANAGEMENT SUMMARY NOTE   Attempted to contact patient today at Home to complete Transitional Case Management call from [facility]. No answer/unable to leave message; 2nd attempt.           Armida LITTIE Flakes, RN

## 2024-11-12 ENCOUNTER — Ambulatory Visit

## 2024-11-12 ENCOUNTER — Ambulatory Visit: Admitting: Neurosurgery

## 2024-11-12 ENCOUNTER — Encounter: Payer: Self-pay | Admitting: Neurosurgery

## 2024-11-12 VITALS — BP 104/68 | Ht 67.0 in | Wt 169.0 lb

## 2024-11-12 DIAGNOSIS — M47812 Spondylosis without myelopathy or radiculopathy, cervical region: Secondary | ICD-10-CM

## 2024-11-12 DIAGNOSIS — M4722 Other spondylosis with radiculopathy, cervical region: Secondary | ICD-10-CM | POA: Diagnosis not present

## 2024-11-12 DIAGNOSIS — M50321 Other cervical disc degeneration at C4-C5 level: Secondary | ICD-10-CM | POA: Insufficient documentation

## 2024-11-12 DIAGNOSIS — Z981 Arthrodesis status: Secondary | ICD-10-CM

## 2024-11-17 ENCOUNTER — Ambulatory Visit: Payer: Self-pay | Admitting: Neurosurgery

## 2024-11-17 NOTE — Progress Notes (Signed)
 Adjacent segment instability, compression at that site on MRI likely worsened with flexion and extension.  Will discuss extension of cervical fusion.

## 2024-11-21 ENCOUNTER — Telehealth: Admitting: Neurosurgery

## 2024-11-25 ENCOUNTER — Ambulatory Visit: Admitting: Neurosurgery

## 2024-11-25 DIAGNOSIS — Z981 Arthrodesis status: Secondary | ICD-10-CM

## 2024-11-25 DIAGNOSIS — M50321 Other cervical disc degeneration at C4-C5 level: Secondary | ICD-10-CM | POA: Diagnosis not present

## 2024-11-25 NOTE — Progress Notes (Signed)
 I had a follow-up phone visit with Cristian Pena.  He was at home and I was in the office.  We initially did this via interpreter but lost contact and reestablish contact directly.  He let me know that he had a recent heart attack on January 13 and is currently on numerous medications after a intervention.  He is doing well with his recovery and sees his cardiologist on Friday.  He continues to have pain in his neck and numbness going into his hands and arms bilaterally.  Will plan to have him come back to see us  in clinic for a full evaluation.  He will likely need an adjacent level surgical intervention.  We asked that he discuss when it would be appropriate for him to have surgery with his cardiologist at his appointment this Friday.  I will have my office reach out on Monday to help schedule an appropriate timeline follow-up.  We spent a total of 10 minutes on the phone today.
